# Patient Record
Sex: Male | Born: 1940 | Race: White | Hispanic: No | Marital: Married | Smoking: Former smoker
Health system: Southern US, Community
[De-identification: ages and names within clinical notes are randomized; demographics above are authoritative.]

## PROBLEM LIST (undated history)

## (undated) DIAGNOSIS — B159 Hepatitis A without hepatic coma: Secondary | ICD-10-CM

## (undated) DIAGNOSIS — G4733 Obstructive sleep apnea (adult) (pediatric): Secondary | ICD-10-CM

## (undated) DIAGNOSIS — G4730 Sleep apnea, unspecified: Secondary | ICD-10-CM

## (undated) DIAGNOSIS — N189 Chronic kidney disease, unspecified: Secondary | ICD-10-CM

## (undated) DIAGNOSIS — Z85828 Personal history of other malignant neoplasm of skin: Secondary | ICD-10-CM

## (undated) DIAGNOSIS — Z8619 Personal history of other infectious and parasitic diseases: Secondary | ICD-10-CM

## (undated) DIAGNOSIS — Z972 Presence of dental prosthetic device (complete) (partial): Secondary | ICD-10-CM

## (undated) DIAGNOSIS — C4490 Unspecified malignant neoplasm of skin, unspecified: Secondary | ICD-10-CM

## (undated) DIAGNOSIS — C801 Malignant (primary) neoplasm, unspecified: Secondary | ICD-10-CM

## (undated) DIAGNOSIS — F419 Anxiety disorder, unspecified: Secondary | ICD-10-CM

## (undated) DIAGNOSIS — E785 Hyperlipidemia, unspecified: Secondary | ICD-10-CM

## (undated) DIAGNOSIS — K219 Gastro-esophageal reflux disease without esophagitis: Secondary | ICD-10-CM

## (undated) DIAGNOSIS — F32A Depression, unspecified: Secondary | ICD-10-CM

## (undated) DIAGNOSIS — E782 Mixed hyperlipidemia: Secondary | ICD-10-CM

## (undated) DIAGNOSIS — M1A9XX0 Chronic gout, unspecified, without tophus (tophi): Secondary | ICD-10-CM

## (undated) DIAGNOSIS — N183 Chronic kidney disease, stage 3 unspecified: Secondary | ICD-10-CM

## (undated) DIAGNOSIS — N529 Male erectile dysfunction, unspecified: Secondary | ICD-10-CM

## (undated) DIAGNOSIS — Z86010 Personal history of colonic polyps: Secondary | ICD-10-CM

## (undated) DIAGNOSIS — Z860101 Personal history of adenomatous and serrated colon polyps: Secondary | ICD-10-CM

## (undated) DIAGNOSIS — N35919 Unspecified urethral stricture, male, unspecified site: Secondary | ICD-10-CM

## (undated) DIAGNOSIS — Z87442 Personal history of urinary calculi: Secondary | ICD-10-CM

## (undated) DIAGNOSIS — M1990 Unspecified osteoarthritis, unspecified site: Secondary | ICD-10-CM

## (undated) DIAGNOSIS — Z9889 Other specified postprocedural states: Secondary | ICD-10-CM

## (undated) DIAGNOSIS — K449 Diaphragmatic hernia without obstruction or gangrene: Secondary | ICD-10-CM

## (undated) HISTORY — PX: OTHER SURGICAL HISTORY: SHX169

## (undated) HISTORY — DX: Chronic kidney disease, unspecified: N18.9

## (undated) HISTORY — DX: Personal history of adenomatous and serrated colon polyps: Z86.0101

## (undated) HISTORY — DX: Unspecified osteoarthritis, unspecified site: M19.90

## (undated) HISTORY — DX: Unspecified malignant neoplasm of skin, unspecified: C44.90

## (undated) HISTORY — DX: Hyperlipidemia, unspecified: E78.5

## (undated) HISTORY — PX: TRANSURETHRAL RESECTION OF PROSTATE: SHX73

## (undated) HISTORY — DX: Gastro-esophageal reflux disease without esophagitis: K21.9

## (undated) HISTORY — PX: TONSILLECTOMY: SUR1361

## (undated) HISTORY — DX: Personal history of colonic polyps: Z86.010

## (undated) HISTORY — DX: Malignant (primary) neoplasm, unspecified: C80.1

## (undated) HISTORY — DX: Sleep apnea, unspecified: G47.30

## (undated) HISTORY — DX: Hepatitis a without hepatic coma: B15.9

---

## 1974-08-29 DIAGNOSIS — Z8619 Personal history of other infectious and parasitic diseases: Secondary | ICD-10-CM

## 1974-08-29 HISTORY — DX: Personal history of other infectious and parasitic diseases: Z86.19

## 1997-08-29 DIAGNOSIS — Z923 Personal history of irradiation: Secondary | ICD-10-CM

## 1997-08-29 DIAGNOSIS — Z8546 Personal history of malignant neoplasm of prostate: Secondary | ICD-10-CM

## 1997-08-29 HISTORY — DX: Personal history of malignant neoplasm of prostate: Z85.46

## 1997-08-29 HISTORY — DX: Personal history of irradiation: Z92.3

## 1997-08-29 HISTORY — PX: INSERTION PROSTATE RADIATION SEED: SUR718

## 1998-01-09 ENCOUNTER — Other Ambulatory Visit: Admission: RE | Admit: 1998-01-09 | Discharge: 1998-01-09 | Payer: Self-pay | Admitting: Urology

## 1998-03-17 ENCOUNTER — Encounter: Admission: RE | Admit: 1998-03-17 | Discharge: 1998-06-15 | Payer: Self-pay | Admitting: Radiation Oncology

## 1999-05-26 ENCOUNTER — Emergency Department (HOSPITAL_COMMUNITY): Admission: EM | Admit: 1999-05-26 | Discharge: 1999-05-26 | Payer: Self-pay

## 2002-08-29 HISTORY — PX: COLONOSCOPY W/ POLYPECTOMY: SHX1380

## 2005-01-10 ENCOUNTER — Ambulatory Visit: Payer: Self-pay | Admitting: Internal Medicine

## 2005-01-14 ENCOUNTER — Ambulatory Visit: Payer: Self-pay | Admitting: Internal Medicine

## 2005-07-19 ENCOUNTER — Ambulatory Visit: Payer: Self-pay | Admitting: Internal Medicine

## 2005-08-17 ENCOUNTER — Ambulatory Visit: Payer: Self-pay | Admitting: Internal Medicine

## 2005-08-29 HISTORY — PX: ESOPHAGEAL DILATION: SHX303

## 2005-09-19 ENCOUNTER — Ambulatory Visit: Payer: Self-pay | Admitting: Internal Medicine

## 2006-07-27 ENCOUNTER — Ambulatory Visit: Payer: Self-pay | Admitting: Internal Medicine

## 2006-07-27 LAB — CONVERTED CEMR LAB
ALT: 25 units/L (ref 0–40)
AST: 25 units/L (ref 0–37)
BUN: 18 mg/dL (ref 6–23)
Creatinine, Ser: 1.8 mg/dL — ABNORMAL HIGH (ref 0.4–1.5)
Glucose, Bld: 95 mg/dL (ref 70–99)
Uric Acid, Serum: 7.7 mg/dL — ABNORMAL HIGH (ref 2.4–7.0)

## 2006-08-02 ENCOUNTER — Encounter: Payer: Self-pay | Admitting: Internal Medicine

## 2006-08-15 ENCOUNTER — Ambulatory Visit: Payer: Self-pay | Admitting: Internal Medicine

## 2006-08-23 ENCOUNTER — Encounter: Payer: Self-pay | Admitting: Internal Medicine

## 2007-04-27 ENCOUNTER — Encounter (INDEPENDENT_AMBULATORY_CARE_PROVIDER_SITE_OTHER): Payer: Self-pay | Admitting: *Deleted

## 2007-04-27 ENCOUNTER — Emergency Department (HOSPITAL_COMMUNITY): Admission: EM | Admit: 2007-04-27 | Discharge: 2007-04-27 | Payer: Self-pay | Admitting: Emergency Medicine

## 2007-04-30 ENCOUNTER — Emergency Department (HOSPITAL_COMMUNITY): Admission: EM | Admit: 2007-04-30 | Discharge: 2007-04-30 | Payer: Self-pay | Admitting: Emergency Medicine

## 2007-06-12 ENCOUNTER — Encounter: Payer: Self-pay | Admitting: Internal Medicine

## 2007-06-12 ENCOUNTER — Ambulatory Visit: Payer: Self-pay | Admitting: Internal Medicine

## 2007-06-12 DIAGNOSIS — Z8546 Personal history of malignant neoplasm of prostate: Secondary | ICD-10-CM | POA: Insufficient documentation

## 2007-06-12 DIAGNOSIS — E782 Mixed hyperlipidemia: Secondary | ICD-10-CM | POA: Insufficient documentation

## 2007-06-12 DIAGNOSIS — Z87442 Personal history of urinary calculi: Secondary | ICD-10-CM | POA: Insufficient documentation

## 2007-06-12 DIAGNOSIS — M109 Gout, unspecified: Secondary | ICD-10-CM | POA: Insufficient documentation

## 2007-06-12 LAB — CONVERTED CEMR LAB
Cholesterol, target level: 200 mg/dL
HDL goal, serum: 40 mg/dL
LDL Goal: 160 mg/dL

## 2007-06-15 ENCOUNTER — Encounter: Payer: Self-pay | Admitting: Internal Medicine

## 2007-06-24 LAB — CONVERTED CEMR LAB
ALT: 21 units/L (ref 0–53)
AST: 20 units/L (ref 0–37)
BUN: 13 mg/dL (ref 6–23)
Cholesterol: 177 mg/dL (ref 0–200)
Creatinine, Ser: 1.5 mg/dL (ref 0.4–1.5)
Direct LDL: 103.1 mg/dL
HDL: 26 mg/dL — ABNORMAL LOW (ref >39.0)
Hgb A1c MFr Bld: 5.3 % (ref 4.6–6.0)
Potassium: 5.2 meq/L — ABNORMAL HIGH (ref 3.5–5.1)
Total CHOL/HDL Ratio: 6.8
Triglycerides: 247 mg/dL (ref 0–149)
Uric Acid, Serum: 6.2 mg/dL (ref 2.4–7.0)
VLDL: 49 mg/dL — ABNORMAL HIGH (ref 0–40)

## 2007-06-26 ENCOUNTER — Encounter (INDEPENDENT_AMBULATORY_CARE_PROVIDER_SITE_OTHER): Payer: Self-pay | Admitting: *Deleted

## 2007-07-17 ENCOUNTER — Telehealth (INDEPENDENT_AMBULATORY_CARE_PROVIDER_SITE_OTHER): Payer: Self-pay | Admitting: *Deleted

## 2007-07-19 ENCOUNTER — Telehealth (INDEPENDENT_AMBULATORY_CARE_PROVIDER_SITE_OTHER): Payer: Self-pay | Admitting: *Deleted

## 2007-07-20 ENCOUNTER — Ambulatory Visit: Payer: Self-pay | Admitting: Internal Medicine

## 2007-07-20 ENCOUNTER — Encounter (INDEPENDENT_AMBULATORY_CARE_PROVIDER_SITE_OTHER): Payer: Self-pay | Admitting: *Deleted

## 2007-08-01 ENCOUNTER — Telehealth (INDEPENDENT_AMBULATORY_CARE_PROVIDER_SITE_OTHER): Payer: Self-pay | Admitting: *Deleted

## 2007-08-06 ENCOUNTER — Ambulatory Visit: Payer: Self-pay | Admitting: Internal Medicine

## 2007-08-14 ENCOUNTER — Ambulatory Visit: Payer: Self-pay | Admitting: Internal Medicine

## 2007-08-14 ENCOUNTER — Encounter: Payer: Self-pay | Admitting: Internal Medicine

## 2007-08-29 ENCOUNTER — Telehealth: Payer: Self-pay | Admitting: Internal Medicine

## 2007-09-03 ENCOUNTER — Telehealth (INDEPENDENT_AMBULATORY_CARE_PROVIDER_SITE_OTHER): Payer: Self-pay | Admitting: *Deleted

## 2008-01-15 ENCOUNTER — Telehealth (INDEPENDENT_AMBULATORY_CARE_PROVIDER_SITE_OTHER): Payer: Self-pay | Admitting: *Deleted

## 2008-06-23 ENCOUNTER — Encounter: Payer: Self-pay | Admitting: Internal Medicine

## 2008-07-04 ENCOUNTER — Encounter: Payer: Self-pay | Admitting: Internal Medicine

## 2008-07-22 ENCOUNTER — Telehealth: Payer: Self-pay | Admitting: Internal Medicine

## 2008-07-25 ENCOUNTER — Ambulatory Visit: Payer: Self-pay | Admitting: Internal Medicine

## 2008-07-25 DIAGNOSIS — R944 Abnormal results of kidney function studies: Secondary | ICD-10-CM

## 2008-07-25 DIAGNOSIS — Z85828 Personal history of other malignant neoplasm of skin: Secondary | ICD-10-CM

## 2008-07-25 DIAGNOSIS — R030 Elevated blood-pressure reading, without diagnosis of hypertension: Secondary | ICD-10-CM | POA: Insufficient documentation

## 2008-07-29 ENCOUNTER — Telehealth (INDEPENDENT_AMBULATORY_CARE_PROVIDER_SITE_OTHER): Payer: Self-pay | Admitting: *Deleted

## 2008-08-04 ENCOUNTER — Telehealth (INDEPENDENT_AMBULATORY_CARE_PROVIDER_SITE_OTHER): Payer: Self-pay | Admitting: *Deleted

## 2008-08-18 ENCOUNTER — Ambulatory Visit: Payer: Self-pay | Admitting: Internal Medicine

## 2008-08-19 ENCOUNTER — Encounter: Payer: Self-pay | Admitting: Internal Medicine

## 2008-08-19 ENCOUNTER — Encounter (INDEPENDENT_AMBULATORY_CARE_PROVIDER_SITE_OTHER): Payer: Self-pay | Admitting: *Deleted

## 2008-08-19 ENCOUNTER — Telehealth (INDEPENDENT_AMBULATORY_CARE_PROVIDER_SITE_OTHER): Payer: Self-pay | Admitting: *Deleted

## 2008-08-19 DIAGNOSIS — N259 Disorder resulting from impaired renal tubular function, unspecified: Secondary | ICD-10-CM | POA: Insufficient documentation

## 2008-08-19 LAB — CONVERTED CEMR LAB
BUN: 19 mg/dL (ref 6–23)
Creatinine, Ser: 1.8 mg/dL — ABNORMAL HIGH (ref 0.4–1.5)
Potassium: 4.1 meq/L (ref 3.5–5.1)
Uric Acid, Serum: 6.6 mg/dL (ref 4.0–7.8)

## 2008-08-20 ENCOUNTER — Encounter: Payer: Self-pay | Admitting: Internal Medicine

## 2008-08-29 HISTORY — PX: CYSTOSCOPY: SUR368

## 2008-09-11 ENCOUNTER — Encounter: Admission: RE | Admit: 2008-09-11 | Discharge: 2008-09-11 | Payer: Self-pay | Admitting: Internal Medicine

## 2008-09-11 ENCOUNTER — Encounter: Payer: Self-pay | Admitting: Internal Medicine

## 2008-09-12 ENCOUNTER — Encounter: Payer: Self-pay | Admitting: Internal Medicine

## 2008-09-18 ENCOUNTER — Telehealth (INDEPENDENT_AMBULATORY_CARE_PROVIDER_SITE_OTHER): Payer: Self-pay | Admitting: *Deleted

## 2008-09-22 ENCOUNTER — Encounter: Payer: Self-pay | Admitting: Internal Medicine

## 2009-05-12 ENCOUNTER — Encounter: Payer: Self-pay | Admitting: Internal Medicine

## 2009-06-11 ENCOUNTER — Telehealth: Payer: Self-pay | Admitting: Internal Medicine

## 2009-06-11 DIAGNOSIS — R1319 Other dysphagia: Secondary | ICD-10-CM

## 2009-06-15 ENCOUNTER — Ambulatory Visit: Payer: Self-pay | Admitting: Internal Medicine

## 2009-06-15 ENCOUNTER — Encounter: Payer: Self-pay | Admitting: Internal Medicine

## 2009-06-16 ENCOUNTER — Encounter: Payer: Self-pay | Admitting: Internal Medicine

## 2009-06-19 ENCOUNTER — Ambulatory Visit: Payer: Self-pay | Admitting: Internal Medicine

## 2009-07-07 ENCOUNTER — Encounter: Payer: Self-pay | Admitting: Internal Medicine

## 2009-07-24 ENCOUNTER — Telehealth: Payer: Self-pay | Admitting: Internal Medicine

## 2009-07-24 DIAGNOSIS — R51 Headache: Secondary | ICD-10-CM

## 2009-07-24 DIAGNOSIS — R519 Headache, unspecified: Secondary | ICD-10-CM | POA: Insufficient documentation

## 2009-08-03 ENCOUNTER — Ambulatory Visit: Payer: Self-pay | Admitting: Internal Medicine

## 2009-08-03 DIAGNOSIS — E559 Vitamin D deficiency, unspecified: Secondary | ICD-10-CM | POA: Insufficient documentation

## 2009-08-10 ENCOUNTER — Encounter: Payer: Self-pay | Admitting: Internal Medicine

## 2010-04-21 ENCOUNTER — Telehealth: Payer: Self-pay | Admitting: Internal Medicine

## 2010-05-25 ENCOUNTER — Encounter: Payer: Self-pay | Admitting: Internal Medicine

## 2010-05-26 ENCOUNTER — Encounter: Payer: Self-pay | Admitting: Internal Medicine

## 2010-06-16 ENCOUNTER — Ambulatory Visit: Payer: Self-pay | Admitting: Internal Medicine

## 2010-06-16 ENCOUNTER — Encounter: Payer: Self-pay | Admitting: Internal Medicine

## 2010-06-16 DIAGNOSIS — D126 Benign neoplasm of colon, unspecified: Secondary | ICD-10-CM | POA: Insufficient documentation

## 2010-08-16 ENCOUNTER — Telehealth (INDEPENDENT_AMBULATORY_CARE_PROVIDER_SITE_OTHER): Payer: Self-pay | Admitting: *Deleted

## 2010-08-17 ENCOUNTER — Ambulatory Visit: Payer: Self-pay | Admitting: Internal Medicine

## 2010-08-24 LAB — CONVERTED CEMR LAB
ALT: 88 units/L — ABNORMAL HIGH (ref 0–53)
AST: 71 units/L — ABNORMAL HIGH (ref 0–37)
Albumin: 3.7 g/dL (ref 3.5–5.2)
Cholesterol: 166 mg/dL (ref 0–200)
Total Protein: 6.4 g/dL (ref 6.0–8.3)

## 2010-08-25 ENCOUNTER — Telehealth: Payer: Self-pay | Admitting: Internal Medicine

## 2010-09-05 ENCOUNTER — Emergency Department (HOSPITAL_COMMUNITY)
Admission: EM | Admit: 2010-09-05 | Discharge: 2010-09-05 | Payer: Self-pay | Source: Home / Self Care | Admitting: Emergency Medicine

## 2010-09-07 ENCOUNTER — Inpatient Hospital Stay (HOSPITAL_COMMUNITY)
Admission: EM | Admit: 2010-09-07 | Discharge: 2010-09-08 | Payer: Self-pay | Source: Home / Self Care | Attending: Urology | Admitting: Urology

## 2010-09-08 HISTORY — PX: CYSTOSCOPY W/ URETERAL STENT PLACEMENT: SHX1429

## 2010-09-09 ENCOUNTER — Ambulatory Visit: Admit: 2010-09-09 | Payer: Self-pay | Admitting: Internal Medicine

## 2010-09-13 LAB — URINALYSIS, ROUTINE W REFLEX MICROSCOPIC
Bilirubin Urine: NEGATIVE
Hgb urine dipstick: NEGATIVE
Ketones, ur: NEGATIVE mg/dL
Nitrite: NEGATIVE
Protein, ur: NEGATIVE mg/dL
Specific Gravity, Urine: 1.016 (ref 1.005–1.030)
Urine Glucose, Fasting: NEGATIVE mg/dL
Urobilinogen, UA: 0.2 mg/dL (ref 0.0–1.0)
pH: 5.5 (ref 5.0–8.0)

## 2010-09-13 LAB — POCT I-STAT, CHEM 8
BUN: 27 mg/dL — ABNORMAL HIGH (ref 6–23)
BUN: 26 mg/dL — ABNORMAL HIGH (ref 6–23)
Calcium, Ion: 1.04 mmol/L — ABNORMAL LOW (ref 1.12–1.32)
Calcium, Ion: 1.18 mmol/L (ref 1.12–1.32)
Chloride: 112 mEq/L (ref 96–112)
Chloride: 108 mEq/L (ref 96–112)
Creatinine, Ser: 2.4 mg/dL — ABNORMAL HIGH (ref 0.4–1.5)
Creatinine, Ser: 3.3 mg/dL — ABNORMAL HIGH (ref 0.4–1.5)
Glucose, Bld: 140 mg/dL — ABNORMAL HIGH (ref 70–99)
Glucose, Bld: 110 mg/dL — ABNORMAL HIGH (ref 70–99)
HCT: 46 % (ref 39.0–52.0)
HCT: 40 % (ref 39.0–52.0)
Hemoglobin: 15.6 g/dL (ref 13.0–17.0)
Hemoglobin: 13.6 g/dL (ref 13.0–17.0)
Potassium: 5.5 mEq/L — ABNORMAL HIGH (ref 3.5–5.1)
Potassium: 4.7 mEq/L (ref 3.5–5.1)
Sodium: 139 mEq/L (ref 135–145)
Sodium: 137 mEq/L (ref 135–145)
TCO2: 21 mmol/L (ref 0–100)
TCO2: 23 mmol/L (ref 0–100)

## 2010-09-13 LAB — BASIC METABOLIC PANEL
BUN: 22 mg/dL (ref 6–23)
CO2: 24 mEq/L (ref 19–32)
Calcium: 8.7 mg/dL (ref 8.4–10.5)
Chloride: 110 mEq/L (ref 96–112)
Creatinine, Ser: 2.93 mg/dL — ABNORMAL HIGH (ref 0.4–1.5)
GFR calc Af Amer: 26 mL/min — ABNORMAL LOW (ref >60)
GFR calc non Af Amer: 21 mL/min — ABNORMAL LOW (ref >60)
Glucose, Bld: 116 mg/dL — ABNORMAL HIGH (ref 70–99)
Potassium: 5.5 mEq/L — ABNORMAL HIGH (ref 3.5–5.1)
Sodium: 139 mEq/L (ref 135–145)

## 2010-09-13 LAB — CBC
HCT: 40.3 % (ref 39.0–52.0)
HCT: 40.6 % (ref 39.0–52.0)
Hemoglobin: 14.1 g/dL (ref 13.0–17.0)
Hemoglobin: 13.8 g/dL (ref 13.0–17.0)
MCH: 33.3 pg (ref 26.0–34.0)
MCH: 32.2 pg (ref 26.0–34.0)
MCHC: 35 g/dL (ref 30.0–36.0)
MCHC: 34 g/dL (ref 30.0–36.0)
MCV: 95 fL (ref 78.0–100.0)
MCV: 94.9 fL (ref 78.0–100.0)
Platelets: 129 10*3/uL — ABNORMAL LOW (ref 150–400)
Platelets: 110 10*3/uL — ABNORMAL LOW (ref 150–400)
RBC: 4.24 MIL/uL (ref 4.22–5.81)
RBC: 4.28 MIL/uL (ref 4.22–5.81)
RDW: 12.7 % (ref 11.5–15.5)
RDW: 12.6 % (ref 11.5–15.5)
WBC: 9.1 10*3/uL (ref 4.0–10.5)
WBC: 9.8 10*3/uL (ref 4.0–10.5)

## 2010-09-13 LAB — URINE CULTURE
Colony Count: NO GROWTH
Culture  Setup Time: 201201101148
Culture: NO GROWTH

## 2010-09-15 ENCOUNTER — Ambulatory Visit
Admission: RE | Admit: 2010-09-15 | Discharge: 2010-09-15 | Payer: Self-pay | Source: Home / Self Care | Attending: Urology | Admitting: Urology

## 2010-09-15 HISTORY — PX: CYSTOSCOPY/RETROGRADE/URETEROSCOPY/STONE EXTRACTION WITH BASKET: SHX5317

## 2010-09-15 LAB — CULTURE, BLOOD (ROUTINE X 2)
Culture  Setup Time: 201201110549
Culture  Setup Time: 201201110549
Culture: NO GROWTH
Culture: NO GROWTH

## 2010-09-20 LAB — BASIC METABOLIC PANEL
BUN: 26 mg/dL — ABNORMAL HIGH (ref 6–23)
CO2: 27 mEq/L (ref 19–32)
Calcium: 9.1 mg/dL (ref 8.4–10.5)
Chloride: 106 mEq/L (ref 96–112)
Creatinine, Ser: 2.09 mg/dL — ABNORMAL HIGH (ref 0.4–1.5)
GFR calc Af Amer: 38 mL/min — ABNORMAL LOW (ref >60)
GFR calc non Af Amer: 32 mL/min — ABNORMAL LOW (ref >60)
Glucose, Bld: 102 mg/dL — ABNORMAL HIGH (ref 70–99)
Potassium: 4.4 mEq/L (ref 3.5–5.1)
Sodium: 140 mEq/L (ref 135–145)

## 2010-09-20 LAB — POCT HEMOGLOBIN-HEMACUE: Hemoglobin: 15.5 g/dL (ref 13.0–17.0)

## 2010-09-20 NOTE — Op Note (Signed)
  NAMEADOLPHO, Chris Burton             ACCOUNT NO.:  [id number]  MEDICAL RECORD NO.:  [id number]          PATIENT TYPE:  INP  LOCATION:  1433                         FACILITY:  [facility]  PHYSICIAN:  Valetta Fuller, M.D.  DATE OF BIRTH:  1941-08-17  DATE OF PROCEDURE: DATE OF DISCHARGE:                              OPERATIVE REPORT   PREOPERATIVE DIAGNOSES: 1. A 5-6 mm proximal right ureteral calculus. 2. Possible febrile urinary tract infection.  POSTOPERATIVE DIAGNOSES: 1. A 5-6 mm proximal right ureteral calculus. 2. Possible febrile urinary tract infection.  PROCEDURE PERFORMED:  Cystoscopy, retrograde pyelogram, and right double- J stent insertion.  SURGEON:  Valetta Fuller, MD  ANESTHESIA:  General.  INDICATIONS:  Mr. Hettich is 70 years of age.  He has had some longstanding renal insufficiency with a creatinine typically around 1.9- 2.0.  Approximately 48 hours ago, the patient developed right flank pain and was seen in the local emergency room where he was diagnosed with 5-6 mm right proximal stone.  He represented yesterday to the hospital with low grade fever and some chills.  Urinalysis relatively unremarkable. Creatinine further to 3.38.  KUB showed stone to be in similar position in the proximal right ureter.  The patient was admitted and started on antibiotic therapy.  He remained stable.  He did develop a fever of 101.7.  For that reason, we felt he was not a good candidate for stone manipulation but should have a double-J stent placed to unobstructed kidney with definitive management at a later date.  He appeared to understand these issues.  Full informed consent obtained.  TECHNIQUE AND FINDINGS:  The patient was brought to the operating room. He had successful induction of general anesthesia.  He was placed in lithotomy position and prepped and draped in usual manner.  Cystoscopy revealed unremarkable anterior urethra, prostatic urethra, and  bladder.  Retrograde pyelogram was done with an open-ended catheter with fluoroscopic interpretation.  A 5-6 mm filling defect was noted in the proximal ureter causing at least partial obstruction with a mildly dilated proximal ureter and collecting system.  Guidewire was placed to the right renal pelvis.  A 7-French 24 cm stent was then placed with visual as well as fluoroscopic guidance.  The patient had no obvious complications and was brought to recovery room in stable condition.     Valetta Fuller, M.D.     DSG/MEDQ  D:  09/08/2010  T:  09/08/2010  Job:  [id number]  cc:   Titus Dubin. Alwyn Ren, MD,FACP,FCCP [phone] [place] [address]  Electronically Signed by Barron Alvine M.D. on 09/20/2010 09:21:47 AM

## 2010-09-20 NOTE — Consult Note (Signed)
Chris Burton, Chris Burton NO.:  [id number]  MEDICAL RECORD NO.:  [id number]          PATIENT TYPE:  EMS  LOCATION:  ED                           FACILITY:  [facility]  PHYSICIAN:  Valetta Fuller, M.D.  DATE OF BIRTH:  06/30/1941  DATE OF CONSULTATION:  09/07/2010 DATE OF DISCHARGE:                                CONSULTATION   CHIEF COMPLAINT:  An approximate 5-6 mm right mid ureteral calculus.  HISTORY OF PRESENT ILLNESS:  This is a 70 year old gentleman who began having complaints of low abdominal pain, nausea, vomiting and right flank pain approximately 2 days ago.  He presented to [facility] Emergency Room at that time.  A CT of his abdomen and pelvis were obtained which showed bilateral nephrolithiasis with an obstructing 5 mm proximal right ureteral calculus.  He was discharged from the emergency room with attempts to pass the stone on his own.  He was provided a prescription for pain and for nausea and vomiting.  He was told to return for fever, chills or continued nausea and vomiting as well as uncontrollable pain. Today he presents  back to [facility] Emergency Room for complaints of fever up to 100.9, nausea, right flank pain with radiation to the right groin.  He denies any urinary urgency, urinary frequency, dysuria or hematuria.  He denies nocturia.  He does complain of diarrhea.  He denies any chest pain or shortness of breath.  Upon presentation to the emergency room a KUB was obtained which showed an approximate 6-mm right mid ureteral calculus between L3 and L4 transverse process.  The position is unchanged since his CT scan on September 05, 2010.  His urinalysis today in the emergency room is negative.  His white blood cell count is 9.1 and his serum creatinine is 3.3 (up from 2.4 on September 05, 2010).  The patient does have a history of renal insufficiency with his baseline at approximately 2.0.  He does see a nephrologist for this.  PAST MEDICAL  HISTORY: 1. Gout. 2. History of hepatitis A. 3. Nephrolithiasis. 4. Prostate cancer.  PAST SURGICAL HISTORY: 1. Tonsillectomy. 2. Placement of radiation seeds for prostate cancer.  FAMILY HISTORY:  Noncontributory.  SOCIAL HISTORY:  He denies tobacco use.  He does occasionally use alcohol.  He is married and lives in [place].  ALLERGIES:  METHYLPARABEN.  MEDICATIONS: 1. Metamucil. 2. Multivitamins. 3. Probenecid. 4. Oxycodone p.r.n. 5. Phenergan p.r.n.  REVIEW OF SYSTEMS:  As stated per HPI.  PHYSICAL EXAMINATION:  VITAL SIGNS:  Temperature 98.1, pulse 92, respiration 20, blood pressure 120/76. CONSTITUTIONAL:  He is a well-developed, well-nourished white male in no acute distress. HEENT:  Normocephalic, atraumatic.  Oropharynx is clear. CARDIOVASCULAR:  Regular rate and rhythm. LUNGS:  Clear to auscultation. ABDOMEN:  Soft, nontender, nondistended with hypoactive bowel sounds and no CVA tenderness. GENITOURINARY:  Penis was without lesion or mass, bilateral descended testes. EXTREMITIES:  Nontender, no atrophy.  LABORATORY DATA: 1. Sodium is 137, potassium 4.7, chloride 108, CO2 23, BUN 26,     creatinine 3.3 (up from 2.4 on September 05, 2010). 2. WBC is 9.1, hemoglobin 13.6, hematocrit  40.0, platelets 129.  RADIOLOGY: 1. CT of the abdomen and pelvis from September 05, 2010, shows bilateral     nephrolithiasis with an obstructing right proximal ureteral     calculus. 2. KUB from September 07, 2010, showed an approximate 6-mm right mid     ureteral calculus located between L3 and L4 transverse process,     unchanged from CT scan on September 05, 2010.  IMPRESSION AND PLAN: 1. A 5-6 mm right mid ureteral calculus. 2. Renal insufficiency with creatinine at 3.3 (baseline 2.0).  We will     plan to admit for IV hydration as well as pain control.  We will     place him nothing by mouth after midnight and Dr. Isabel Caprice will see     this gentleman later this afternoon.  We  will place him on the     surgical schedule for September 08, 2010, for procedure consisting of     cystoscopy, ureteroscopy, right retrograde pyelography, right     double-J stent placement and right holmium laser lithotripsy.     Delia Chimes, NP   ______________________________ Valetta Fuller, M.D.    MA/MEDQ  D:  09/07/2010  T:  09/07/2010  Job:  [id number]  Electronically Signed by Delia Chimes NP on 09/08/2010 11:46:04 AM Electronically Signed by Barron Alvine M.D. on 09/20/2010 09:21:44 AM

## 2010-09-20 NOTE — Op Note (Signed)
  Chris Burton, Chris Burton             ACCOUNT NO.:  [id number]  MEDICAL RECORD NO.:  [id number]          PATIENT TYPE:  AMB  LOCATION:  NESC                         FACILITY:  [facility]  PHYSICIAN:  Valetta Fuller, M.D.  DATE OF BIRTH:  08-23-1941  DATE OF PROCEDURE: DATE OF DISCHARGE:                              OPERATIVE REPORT   PREOPERATIVE DIAGNOSIS:  Right proximal ureteral calculus.  POSTOPERATIVE DIAGNOSIS:  Right proximal ureteral calculus.  PROCEDURES PERFORMED:  Cystoscopy, right double-J stent removal, ureteroscopy, basket extraction of ureteral calculus and replacement of double-J stent.  SURGEON:  Valetta Fuller, M.D.  ANESTHESIA:  General.  INDICATIONS:  [name] is 70 years of age.  He had presented with abdominal pain and right flank pain.  CT showed some bilateral nephrolithiasis with an obstructing 5-6 mm proximal ureteral stone.  He was initially discharged to home but then returned because of low grade temperature and chills.  The patient was admitted and developed a fever of 101.7.  His baseline creatinine which is normally around 2.0 had increased to 3.3.  For that reason, the patient underwent a cystoscopy with retrograde pyelogram and placement of a right double-J stent.  We did not feel that stone manipulation was indicated at that time given the febrile history and concern over possible concurrent urinary tract infection.  Double-J stent was placed.  The patient's situation clinically has improved dramatically.  The double-J stent has been well tolerated by the patient.  It has been indwelling for approximately 1 week.  He presents now for definitive management of the stone.  TECHNIQUE AND FINDINGS:  The patient was brought to the operating room. He had successful induction of general anesthesia, was placed in lithotomy position and prepped and draped in the usual manner. Appropriate surgical time-out was performed.  Cystoscopy again revealed an  unremarkable anterior urethra.  Mild trilobar hyperplasia.  Stent was seen in good position.  The stent was partially extracted and the guidewire placed through the stent.  The right ureter was then easily engaged with a 6.5 [place] rigid ureteroscope.  Approximately two-thirds of the way up the ureter at the junction of the proximal and mid ureter, a 5-6 mm hard calculus was noted.  This was basket extracted without difficulty.  There was some evidence of mucosal inflammation and edema where the stone had been previously impacted.  For that reason, over the guidewire, we placed a new 7-French 24 cm double-J stent with a dangle string.  Fluoroscopic and visual guidance was used to assure good positioning.  The string was secured to the patient's penis and lidocaine jelly was instilled.  The patient appeared to tolerate the procedure well, had no obvious complications or difficulties, and was brought to recovery room in stable condition.     Valetta Fuller, M.D.     DSG/MEDQ  D:  09/15/2010  T:  09/15/2010  Job:  [id number] Electronically Signed by Barron Alvine M.D. on 09/20/2010 09:21:52 AM

## 2010-09-26 LAB — CONVERTED CEMR LAB: LDL Goal: 100 mg/dL

## 2010-09-28 NOTE — Letter (Signed)
Summary: [facility]  [facility]   Imported By: Lanelle Bal 06/09/2010 13:54:59  _____________________________________________________________________  External Attachment:    Type:   Image     Comment:   External Document

## 2010-09-28 NOTE — Progress Notes (Signed)
Summary: Med refill  ---- Converted from flag ---- ---- 04/21/2010 3:37 PM, Marga Melnick MD wrote: please put these in as med refill , not Appen. Refill X 6 mos ------------------------------  Phone Note Refill Request   Refills Requested: Medication #1:  PROBENECID 500 MG  TABS 2 tabs qd Initial call taken by: Lucious Groves CMA,  April 21, 2010 3:52 PM    Prescriptions: PROBENECID 500 MG  TABS (PROBENECID) 2 tabs qd  #420 x 6   Entered by:   Lucious Groves CMA   Authorized by:   Marga Melnick MD   Signed by:   Lucious Groves CMA on 04/21/2010   Method used:   [facility]  [address] [phone][facility] (retail)       [address]       [place], [place]  [address]       Ph: [phone] or [phone]       Fax: [phone]   RxID:   [id number]

## 2010-09-28 NOTE — Letter (Signed)
Summary: Letter to Patient with Lab Results/[facility]  Letter to Patient with Lab Results/[facility]   Imported By: Lanelle Bal 06/19/2010 10:39:08  _____________________________________________________________________  External Attachment:    Type:   Image     Comment:   External Document

## 2010-09-28 NOTE — Assessment & Plan Note (Signed)
Summary: YEARLY//PH   Vital Signs:  Patient profile:   70 year old male Height:      71.5 inches Weight:      197.6 pounds BMI:     27.27 Temp:     99.0 degrees F oral Pulse rate:   72 / minute Resp:     16 per minute BP sitting:   120 / 78  (left arm) Cuff size:   large  Vitals Entered By: Shonna Chock CMA (June 16, 2010 10:56 AM) CC: CPX Comments Flu & TDaP Vaccine UTD (per patient)   CC:  CPX.  History of Present Illness: Hyperlipidemia Follow-Up      This is a 70 year old man who presents for Hyperlipidemia follow-up.  The patient reports  recent constipation, but denies muscle aches, GI upset, abdominal pain, flushing, itching, and diarrhea.  The patient denies the following symptoms: chest pain/pressure, exercise intolerance, dypsnea, palpitations, syncope, and pedal edema.  Dietary compliance has been good- excellent.  The patient reports exercising daily as 45 min of calisthentics & CVE 90 min 3X/week @ gym.  Adjunctive measures currently used by the patient include fiber( Metamucil)  and niacin in a B complex. Lipids 05/25/2010 @ [facility] : LDL 117, TG 193, HDL 33 on no Rx meds.  LDL goal = < 100 as per Dr Rosey Bath, Nephrologist as  CRD is MI risk.Dr Kyra Manges notes reviewed.Because of CRD & PMH of gout  his diet is heavily weighted with carbs. This contributes to the TG elevation as noted.  Current Medications (verified): 1)  Probenecid 500 Mg  Tabs (Probenecid) .... 2 Tabs Qd 2)  Chloest Off 900mg  .... 1 By Mouth Two Times A Day 3)  Prostate Health  Caps (Misc Natural Products) .Marland Kitchen.. 1 By Mouth Once Daily 4)  Fiber 0.52 Gm Caps (Psyllium) .... 4 By Mouth Once Daily 5)  Super B Complex  Tabs (B Complex-C) .Marland Kitchen.. 1 By Mouth Two Times A Day 6)  Melatonin 3 Mg Caps (Melatonin) .Marland Kitchen.. 1 By Mouth At Bedtime As Needed  Allergies: 1)  ! * Fish Oil 2)  ! * Methoparobam  Past History:  Past Medical History: Gout Hyperlipidemia: LDL goal = < 100 Nephrolithiasis,PMH  of Prostate  cancer, PMH  of S/P seed implants & external radiation, Dr Charlett Blake , [facility] , [place] Hepatitis A, PMH of  1976 Skin cancer,PMH  of, basal cell  Trochlear Headaches, onset with Niacin & fish oil supplements  Past Surgical History: Tonsillectomy Colon polypectomy 2004 1999 External beam radiation & seed implants for prostate cancer, Dr Charlett Blake, [facility] Colon polypectomy 2004, 2008; Endoscopy  & esophageal dilation 2010, Dr Juanda Chance  Family History: Father: prostate  cancer, migraines Mother: CHF,lung cancer, smoker Siblings: sister cancer stomach; no FH renal disease, CVA or MI  Social History: low purine, increased carb Occupation:Professor of Archeology Married Former Smoker: quit age 70 Alcohol use-no Regular exercise-yes. He spends 7 months in [place] with sun exposure & no processed foods & drinks ; these factors will impact  his vit D & lipid risks.His exercise level will increase in [place]  Review of Systems  The patient denies anorexia, fever, vision loss, decreased hearing, hoarseness, prolonged cough, hemoptysis, abdominal pain, melena, hematochezia, severe indigestion/heartburn, hematuria, suspicious skin lesions, unusual weight change, abnormal bleeding, enlarged lymph nodes, and angioedema.   Psych:  Complains of depression, easily angered, easily tearful, and irritability; denies anxiety; Situational depression; family stresses. Meds declined. Allergy:  Complains of itching eyes, seasonal allergies, and sneezing;  No meds.  Physical Exam  General:  well-nourished;alert,appropriate and cooperative throughout examination Neck:  No deformities, masses, or tenderness noted. Lungs:  Normal respiratory effort, chest expands symmetrically. Lungs are clear to auscultation, no crackles or wheezes. Heart:  Normal rate and regular rhythm. S1 and S2 normal without gallop, murmur, click, rub .S4 with slurring Abdomen:  Bowel sounds positive,abdomen soft and non-tender without masses,  organomegaly or hernias noted. Genitalia:  Dr Charlett Blake Prostate:  Dr Charlett Blake Msk:  No deformity or scoliosis noted of thoracic or lumbar spine.   Pulses:  R and L carotid,radial,dorsalis pedis and posterior tibial pulses are full and equal bilaterally Extremities:  No clubbing, cyanosis, edema, or deformity noted with normal full range of motion of all joints.   Neurologic:  alert & oriented X3 and DTRs symmetrical and normal.   Skin:  Intact without suspicious lesions or rashes. Cherry angiomata Cervical Nodes:  No lymphadenopathy noted Axillary Nodes:  No palpable lymphadenopathy Psych:  memory intact for recent and remote, normally interactive, good eye contact, not anxious appearing, and not depressed appearing.     Impression & Recommendations:  Problem # 1:  HYPERLIPIDEMIA (ICD-272.2)  LDL goal = < 100  Orders: EKG w/ Interpretation (93000)  His updated medication list for this problem includes:    Trilipix 135 Mg Cpdr (Choline fenofibrate) .Marland Kitchen... 1 once daily  Problem # 2:  RENAL INSUFFICIENCY (ICD-588.9) CRD  Stage III, as per Dr Rosey Bath  Problem # 3:  PROSTATE CANCER, HX OF (ICD-V10.46) as per Dr Charlett Blake  Problem # 4:  GOUT (ICD-274.9) quiescent , uric acid 6.7 His updated medication list for this problem includes:    Probenecid 500 Mg Tabs (Probenecid) .Marland Kitchen... 2 tabs qd  Problem # 5:  COLONIC POLYPS (ICD-211.3) Colonoscopy as per Dr Juanda Chance  Complete Medication List: 1)  Probenecid 500 Mg Tabs (Probenecid) .... 2 tabs qd 2)  Chloest Off 900mg   .... 1 by mouth two times a day 3)  Prostate Health Caps (Misc natural products) .Marland Kitchen.. 1 by mouth once daily 4)  Fiber 0.52 Gm Caps (Psyllium) .... 4 by mouth once daily 5)  Super B Complex Tabs (B complex-c) .Marland Kitchen.. 1 by mouth two times a day 6)  Melatonin 3 Mg Caps (Melatonin) .Marland Kitchen.. 1 by mouth at bedtime as needed 7)  Trilipix 135 Mg Cpdr (Choline fenofibrate) .Marland Kitchen.. 1 once daily  Patient Instructions: 1)  Please schedule a  follow-up  fasting Lab appointment in 2 months for :Hepatic Panel :995.20; 2)  Lipid Panel:272.4. Prescriptions: TRILIPIX 135 MG CPDR (CHOLINE FENOFIBRATE) 1 once daily  #30 x 2   Entered and Authorized by:   Marga Melnick MD   Signed by:   Marga Melnick MD on 06/16/2010   Method used:   Printed then faxed to ...       [facility]  [phone]* (retail)       [address] [place], [place]  [address]       Ph: [phone] or [phone]       Fax: [phone]   RxID:   [id number]    Orders Added: 1)  Est. Patient Level IV [34742] 2)  EKG w/ Interpretation [93000]

## 2010-09-30 NOTE — Progress Notes (Signed)
Summary: Results   Phone Note Call from Patient   Details for Reason: Two liver enzymes are elevated ; these were 23 & 22 on 05/26/2010 @ [facility]. Please stop Trilipix & avoid excess Tylenol, alcohol & vitamin A. Please repeat fasting AST & ALT in 10-14 days. (790.4). Hopp Summary of Call: Patient left message on triage requesting his results. I called to notify, left message on machine to call back to office. Chris Burton CMA  August 25, 2010 12:04 PM   Patient notified. Chris Burton CMA  August 25, 2010 3:24 PM

## 2010-09-30 NOTE — Progress Notes (Signed)
Summary: Refill Request  Phone Note Refill Request Call back at [phone] Message from:  Pharmacy on August 16, 2010 3:42 PM  Refills Requested: Medication #1:  TRILIPIX 135 MG CPDR 1 once daily.   Dosage confirmed as above?Dosage Confirmed   Supply Requested: 6 months   Notes: Patient is going to [place] for 6 months, needs rx to cover this[facility] [address]   Next Appointment Scheduled: 12.20.11 9lab) Initial call taken by: Harold Barban,  August 16, 2010 3:43 PM    New/Updated Medications: TRILIPIX 135 MG CPDR (CHOLINE FENOFIBRATE) 1 once daily Prescriptions: TRILIPIX 135 MG CPDR (CHOLINE FENOFIBRATE) 1 once daily  #180 x 0   Entered by:   Doristine Devoid CMA   Authorized by:   Marga Melnick MD   Signed by:   Doristine Devoid CMA on 08/16/2010   Method used:   [facility]        [facility]  [address] [phone]* (retail)       [address]       [place], [place]  [address]       Ph: [phone] or [phone]       Fax: [phone]   RxID:   [id number]

## 2011-01-14 NOTE — Assessment & Plan Note (Signed)
[facility]                        [place] OFFICE NOTE   FINAS, DELONE                    MRN:          [id number]  DATE:07/27/2006                            DOB:          04/12/1941    Chris Burton was seen July 27, 2006, for refill of his  probenecid.  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   He questions whether he might have sleep apnea, based on his wife's  description.   He previously was no Niacin and fish oil but stopped this because of  some optic nerve issue.  He states that an MRI analysis was done in  [place] to evaluate his ophthalmologic problems.  He was diagnosed as  having inflammation of the muscle next to the optic nerve which  manifested itself as acute pain.  Apparently, according to  ophthalmologic literature, this might be related to Niacin or Omega 3  fatty acids.   He states that he jolts himself awake from a deep sleep.  He denies  any daytime somnolence or motor vehicle accidents.  He wakes up 2-3  times per night.   His PSA is being monitored by Dr. Cindie Crumbly in [place], [place].  He  received 23 external beam radiation treatments in 1998 plus 60 palladium  seed implants.  Colonoscopy in 2004 revealed tiny polyps; repeat is due  in between 2007 and 2009.  He was found to have radiation proctitis at  that time.  He has a past history of simplex dermatitis and Hepatitis A.  He has also had basal cell cancer, dyslipidemia and gout.   HE IS INTOLERANT OR ALLERGIC TO METHYLPARABEN, ALLOPURINOL (CAUSED  CAPILLARY FRAGILITY).  BANANAS.   Weight was stable, pulse was 60 and blood pressure 116/70. THYROID:  Was  firm without nodularity.  The remainder of the exam was unremarkable.  GENITOURINARY EXAM:  Was deferred to Dr. Cindie Crumbly.   An NMR LipoProfile has been recommended to evaluate  his dyslipidemia.   I have recommendation an annual ophthalmologic exam.   Split night sleep study was declined today by Dr. Sol Blazing.  A glucose,  BUN, SGOT and SGPT were normal.  His creatinine is 1.8; it was 1.4 in  November of 2006.  Uric acid has risen from 5.5 to 7.7.  It is unclear  whether he received a contrast agent with the MRI, which might explain  this.  He works in [place] in high temperatures and is at risk for  dehydration.  At this time his BUN is normal at 18.  Forced oral  hydration to at least 40 ounces of water daily would be recommended.   The probenecid, which is 500 mg two tablets daily, should be kept at the  lowest possible dose to control gout and should not be taken at a dose  of higher than 1 gm.  Purine restriction should be continued.   A Nephrology consultation would be recommended because of a significant  change in his creatinine from November, 2006 to  November, 2007.     Titus Dubin. Alwyn Ren, MD,FACP,FCCP  Electronically Signed    [facility]  DD: 08/03/2006  DT: 08/03/2006  Job #: [id number]   cc:   Joelene Millin

## 2011-06-10 LAB — DIFFERENTIAL
Eosinophils Relative: 0
Lymphocytes Relative: 10 — ABNORMAL LOW
Lymphs Abs: 0.9
Lymphs Abs: 1.6
Monocytes Absolute: 1.1 — ABNORMAL HIGH
Monocytes Relative: 7
Neutro Abs: 8.3 — ABNORMAL HIGH
Neutrophils Relative %: 77

## 2011-06-10 LAB — CBC
HCT: 46.5
Hemoglobin: 16.3
MCHC: 34.8
MCHC: 35
MCV: 92.7
MCV: 92.2
Platelets: 137 — ABNORMAL LOW
RDW: 12.9

## 2011-06-10 LAB — URINALYSIS, ROUTINE W REFLEX MICROSCOPIC
Bilirubin Urine: NEGATIVE
Bilirubin Urine: NEGATIVE
Glucose, UA: NEGATIVE
Ketones, ur: NEGATIVE
Ketones, ur: NEGATIVE
Protein, ur: NEGATIVE
Urobilinogen, UA: 0.2
pH: 6.5

## 2011-06-10 LAB — URINE CULTURE
Colony Count: NO GROWTH
Culture: NO GROWTH

## 2011-06-10 LAB — COMPREHENSIVE METABOLIC PANEL
AST: 20
Albumin: 3.3 — ABNORMAL LOW
BUN: 19
Calcium: 8.6
Calcium: 9.7
Creatinine, Ser: 3.24 — ABNORMAL HIGH
Creatinine, Ser: 1.79 — ABNORMAL HIGH
GFR calc Af Amer: 23 — ABNORMAL LOW
GFR calc non Af Amer: 38 — ABNORMAL LOW
Glucose, Bld: 132 — ABNORMAL HIGH
Total Protein: 5.8 — ABNORMAL LOW
Total Protein: 6.6

## 2011-06-10 LAB — URINE MICROSCOPIC-ADD ON

## 2011-06-20 ENCOUNTER — Telehealth: Payer: Self-pay | Admitting: Internal Medicine

## 2011-06-24 ENCOUNTER — Telehealth: Payer: Self-pay | Admitting: Internal Medicine

## 2011-06-24 MED ORDER — PROBENECID 500 MG PO TABS: 500.0000 mg | ORAL_TABLET | Freq: Two times a day (BID) | ORAL | Status: DC

## 2011-06-24 NOTE — Telephone Encounter (Signed)
RX sent

## 2011-06-24 NOTE — Telephone Encounter (Signed)
Patient wants refill & would like addl refill till his appt in dec Probenecid 500mg  2 tab each day  [facility] [address]

## 2011-08-19 ENCOUNTER — Encounter: Payer: Self-pay | Admitting: Internal Medicine

## 2011-08-22 ENCOUNTER — Encounter: Payer: Self-pay | Admitting: Internal Medicine

## 2011-08-22 ENCOUNTER — Ambulatory Visit (INDEPENDENT_AMBULATORY_CARE_PROVIDER_SITE_OTHER): Payer: BC Managed Care – PPO | Admitting: Internal Medicine

## 2011-08-22 VITALS — BP 114/68 | HR 68 | Temp 97.5°F | Resp 12 | Ht 72.0 in | Wt 202.8 lb

## 2011-08-22 DIAGNOSIS — R030 Elevated blood-pressure reading, without diagnosis of hypertension: Secondary | ICD-10-CM

## 2011-08-22 DIAGNOSIS — Z0000 Encounter for general adult medical examination without abnormal findings: Secondary | ICD-10-CM

## 2011-08-22 DIAGNOSIS — E782 Mixed hyperlipidemia: Secondary | ICD-10-CM

## 2011-08-22 DIAGNOSIS — E559 Vitamin D deficiency, unspecified: Secondary | ICD-10-CM

## 2011-08-22 NOTE — Assessment & Plan Note (Signed)
BP has been well controlled w/o meds; it is usually 120/80 as per home records. EKG is normal; no HTN changes present.

## 2011-08-22 NOTE — Patient Instructions (Signed)
Follow a  low carb nutrition program such as [place] or [employer]  to prevent Diabetes  . White carbohydrates (potatoes, rice, bread, and pasta) have a high spike of sugar and a high load of sugar. For example a  baked potato has a cup of sugar and a  french fry  2 teaspoons of sugar. Yams, wild  rice, whole grained bread &  wheat pasta have been much lower spike and load of  sugar. Portions should be the size of a deck of cards or your palm. The most common cause of elevated triglycerides is the ingestion of sugar from high fructose corn syrup sources added to processed foods & drinks.  Eat a low-fat diet with lots of fruits and vegetables, up to 7-9 servings per day. Consume less than 40 grams of sugar per day from foods & drinks with High Fructose Corn Syrup (HFCS) sugar as #1,2,3 or # 4 on label.Whole Foods, [employer] & [employer] do not carry products with HFCS. Follow a  low carb nutrition program such as [place] or [employer]  to prevent Diabetes progression . White carbohydrates (potatoes, rice, bread, and pasta) have a high spike of sugar and a high load of sugar. For example a  baked potato has a cup of sugar and a  french fry  2 teaspoons of sugar. Yams, wild  rice, whole grained bread &  wheat pasta have been much lower spike and load of  sugar. Portions should be the size of a deck of cards or your palm.   As Triglycerides go up, HDL or good cholesterol goes down. Also uric acid which causes gout will go up.

## 2011-08-22 NOTE — Assessment & Plan Note (Signed)
Dr Rosey Bath D/Ced vitamin D supplement due to renal insufficiency

## 2011-08-22 NOTE — Progress Notes (Signed)
Subjective:    Patient ID: Chris Burton, male    DOB: 09-18-1940, 70 y.o.   MRN: [id number]  HPI Medicare Wellness Visit:  The following psychosocial & medical history were reviewed as required by Medicare.   Social history: caffeine: 2 cups coffee/ day , alcohol:  no ,  tobacco use : quit 1962  & exercise : 45 min daily & 90 min in gym 3 X/ week .   Home & personal  safety / fall risk: no issues, activities of daily living: no limitations , seatbelt use : yes , and smoke alarm employment : yes .  Power of Attorney/Living Will status : in place  Vision ( as recorded per Nurse) & Hearing  evaluation :  Ophth exam 2011. Wall chart read & whisper heard @ 6 ft Orientation :oriented X 3 , memory & recall :good, spelling  testing: good,and mood & affect : normal . Depression / anxiety: no issues Travel history : in [place] up to 8 months/ year , immunization status :? Shingles needed , transfusion history:  no, and preventive health surveillance ( colonoscopy, BMD , etc as per protocol/ [facility]): colonoscopy up to date, Dental care:  Seen @ least annually . Chart reviewed &  Updated. Active issues reviewed & addressed.       Review of Systems Dyslipidemia assessment: Prior Advanced Lipid Testing: NMR Lipoprofile.   Family history of premature CAD/ MI: no .  Nutrition: heart healthy .  Diabetes : no . HTN: BP intermittently rarely. Weight :  Essentially stable. Med compliance : not on statin ; taking supplement. Note: Glucosamine started recently ROS: fatigue: no ; chest pain : no ;claudication: no; palpitations: no; abd pain/bowel changes: some constipation ; myalgias:no;  syncope : no ; memory loss: no;skin changes: no. Lab results reviewed :LDL 98; TG 241 @ [facility] 10/12     Objective:   Physical Exam Gen.: Healthy and well-nourished in appearance. Alert, appropriate and cooperative throughout exam. Head: Normocephalic without obvious abnormalities;  pattern alopecia  Eyes: No corneal or  conjunctival inflammation noted. Pupils equal round reactive to light and accommodation. Fundal exam is benign without hemorrhages, exudate, papilledema. Extraocular motion intact. Vision grossly normal. Ears: External  ear exam reveals no significant lesions or deformities. Canals: wax R > L. Nose: External nasal exam reveals no deformity or inflammation. Nasal mucosa are pink and moist. No lesions or exudates noted.  Mouth: Oral mucosa and oropharynx reveal no lesions or exudates. Teeth in good repair. Neck: No deformities, masses, or tenderness noted. Range of motion & . Thyroid normal. Lungs: Normal respiratory effort; chest expands symmetrically. Lungs are clear to auscultation without rales, wheezes, or increased work of breathing. Heart: Normal rate and rhythm. Normal S1 and S2. No gallop, click, or rub. S 4 w/o  murmur. Abdomen: Bowel sounds normal; abdomen soft and nontender. No masses, organomegaly or hernias noted. Genitalia/ DRE : Dr Cindie Crumbly, [place], [place]   .                                                                                   Musculoskeletal/extremities: No deformity or scoliosis noted of  the thoracic or lumbar  spine. No clubbing, cyanosis, edema, or deformity noted. Range of motion  normal .Tone & strength  normal.Joints normal. Nail health  good. Vascular: Carotid, radial artery, dorsalis pedis and  posterior tibial pulses are full and equal. No bruits present. Neurologic: Alert and oriented x3. Deep tendon reflexes symmetrical and normal.          Skin: Intact without suspicious lesions or rashes. Lymph: No cervical, axillary lymphadenopathy present. Psych: Mood and affect are normal. Normally interactive                                                                                         Assessment & Plan:  #1 Medicare Wellness Exam; criteria met ; data entered #2 Problem List reviewed ; Assessment/ Recommendations made Plan: see Orders

## 2011-08-22 NOTE — Assessment & Plan Note (Signed)
TG elevated ; LDL @ goal of < 100. Nutritional interventions discussed

## 2011-08-29 ENCOUNTER — Encounter: Payer: Self-pay | Admitting: Internal Medicine

## 2011-09-12 ENCOUNTER — Other Ambulatory Visit: Payer: Self-pay | Admitting: Internal Medicine

## 2012-02-28 IMAGING — CT CT ABD-PELV W/O CM
2 of 4 series · 17 of 46 positions shown, 19 images · non-contrast
Comparison: 04/27/2007

CLINICAL DATA: Right flank pain.  History nephrolithiasis.

CT ABDOMEN AND PELVIS WITHOUT CONTRAST
TECHNIQUE: Multidetector CT imaging of the abdomen and pelvis was
performed following the standard protocol without intravenous
contrast.

[Series 2: stone_wo 5.0 b40f st · axial · 0.79mm/px · z∈[-512,-106]mm · 14 of 89 slices shown, 16 images]
[im 4/89  soft-tissue]
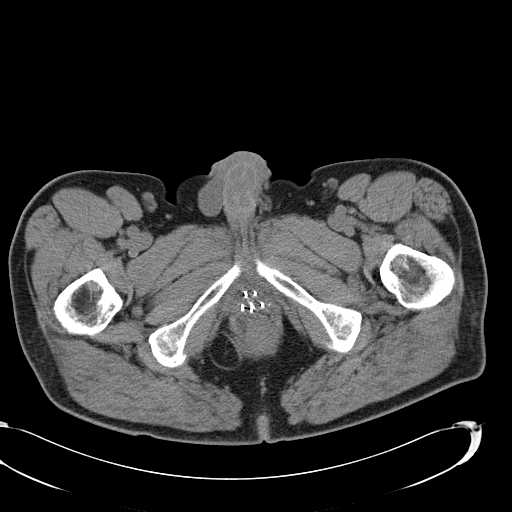
[im 4/89  bone]
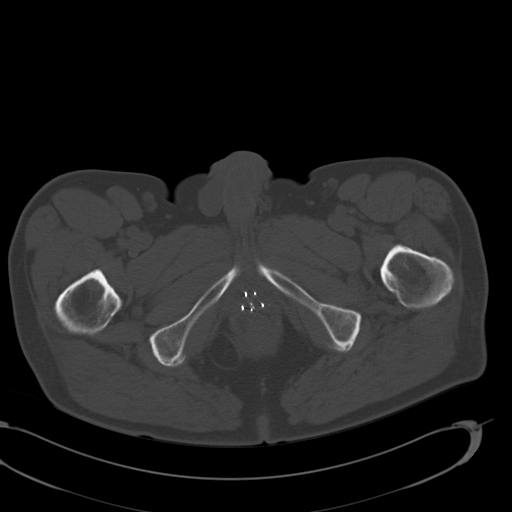
[im 11/89  soft-tissue]
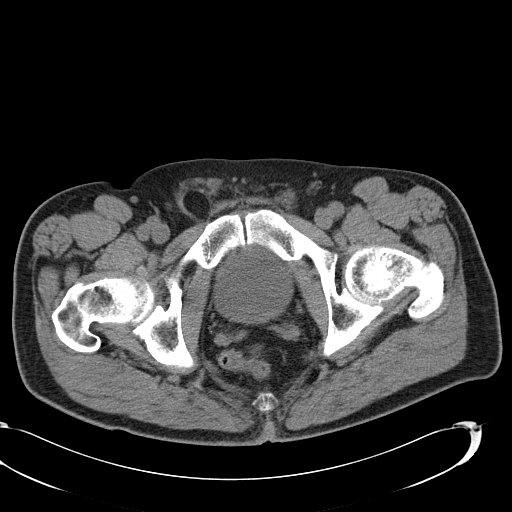
[im 18/89  soft-tissue]
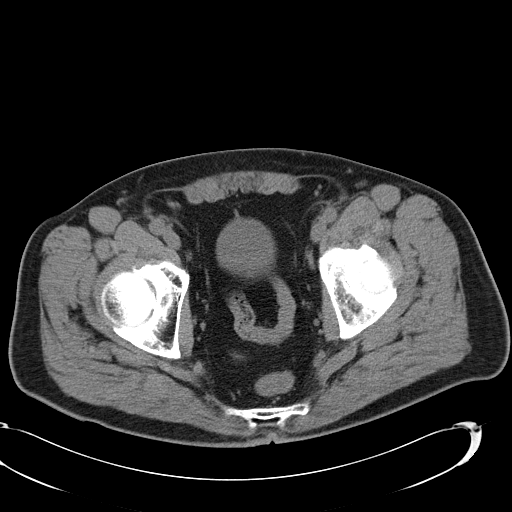
[im 25/89  soft-tissue]
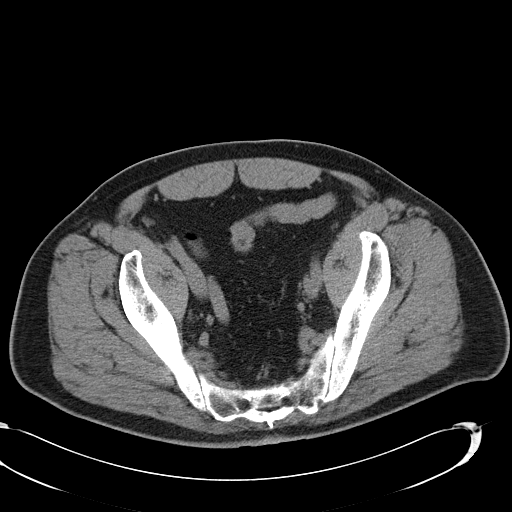
[im 29/89  soft-tissue]
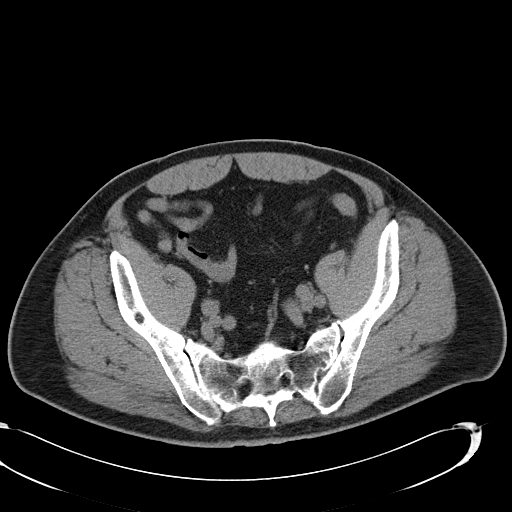
[im 36/89  soft-tissue]
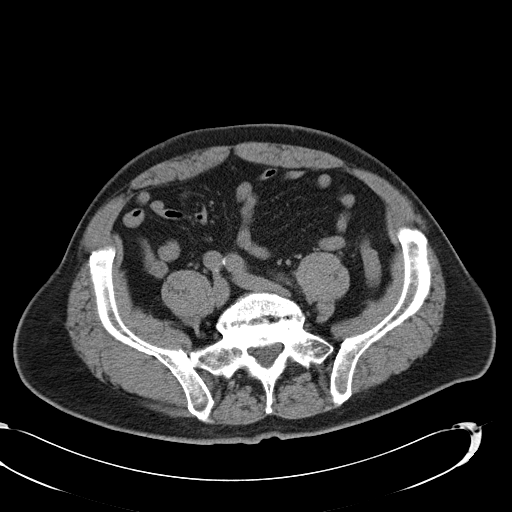
[im 43/89  soft-tissue]
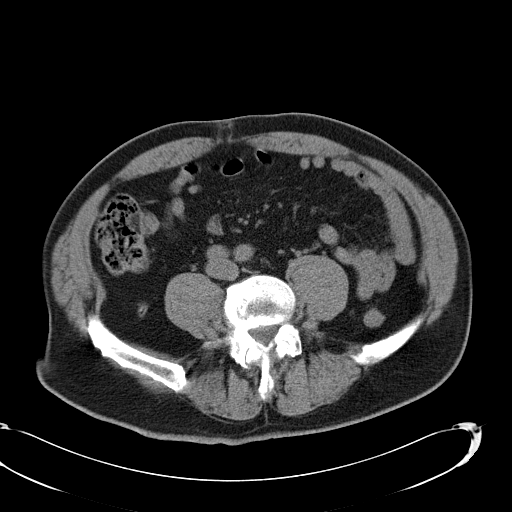
[im 46/89  soft-tissue]
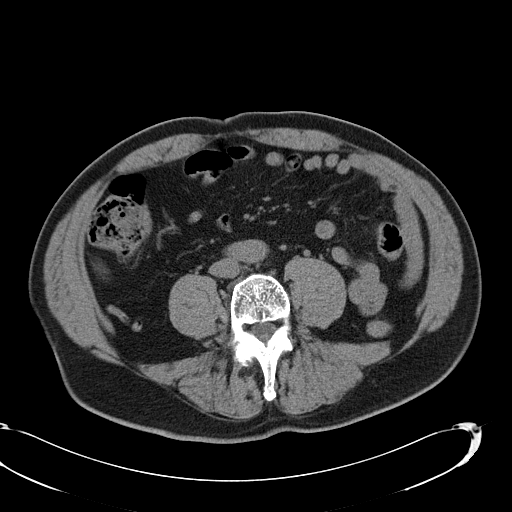
[im 53/89  soft-tissue]
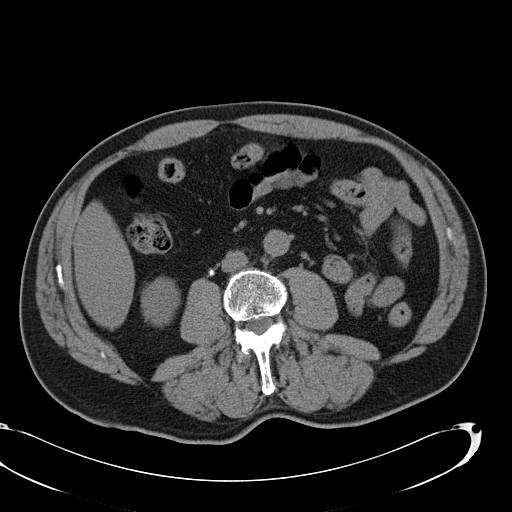
[im 53/89  bone]
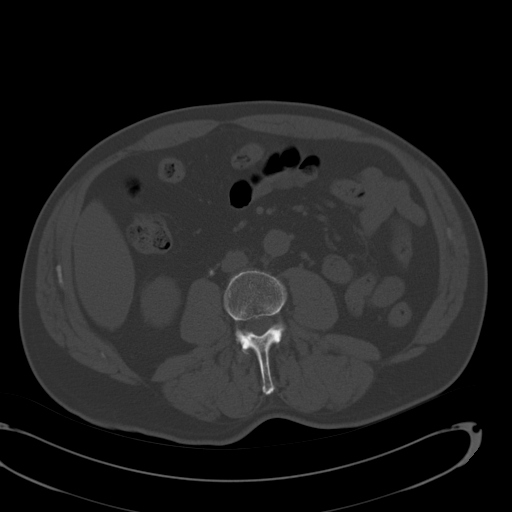
[im 60/89  soft-tissue]
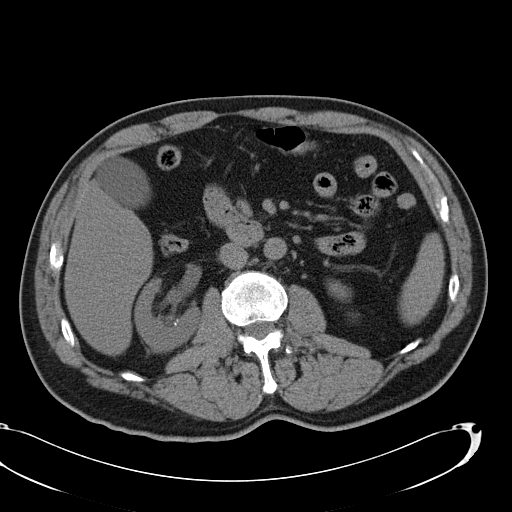
[im 67/89  soft-tissue]
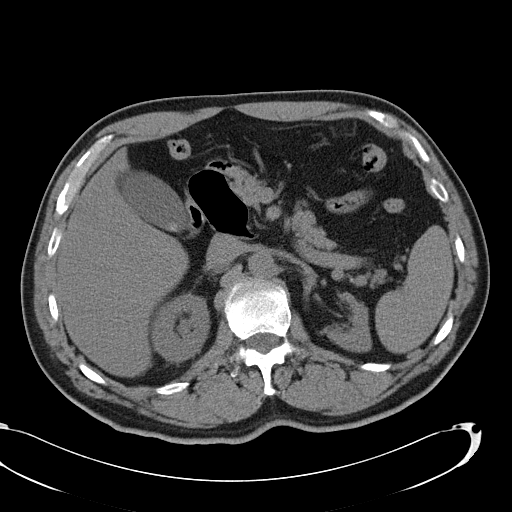
[im 71/89  soft-tissue]
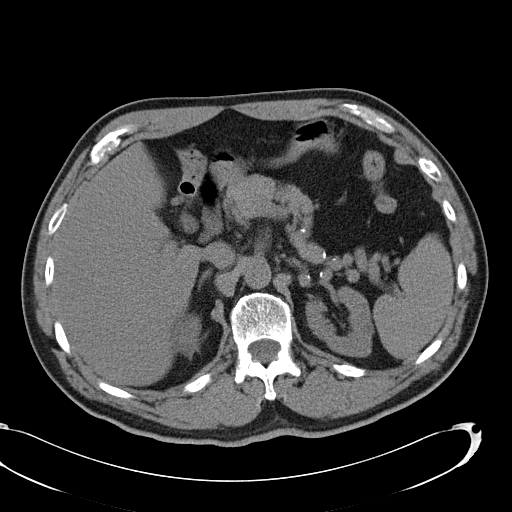
[im 78/89  soft-tissue]
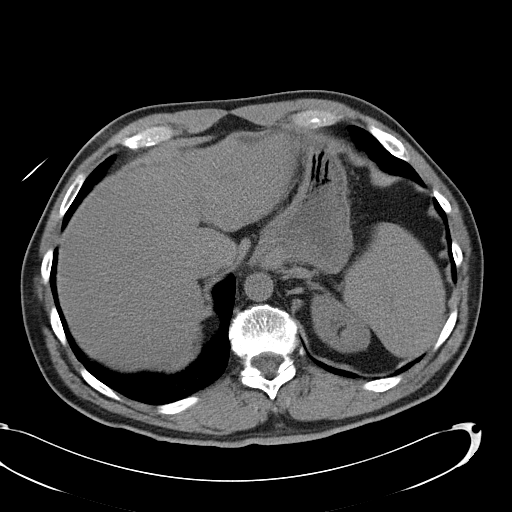
[im 85/89  soft-tissue]
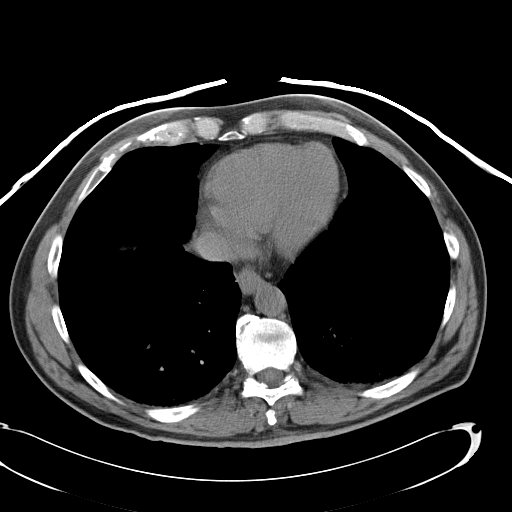

[Series 602: coronal abdomen · coronal · 0.90mm/px · 3 of 131 slices shown]
[im 44/131  soft-tissue]
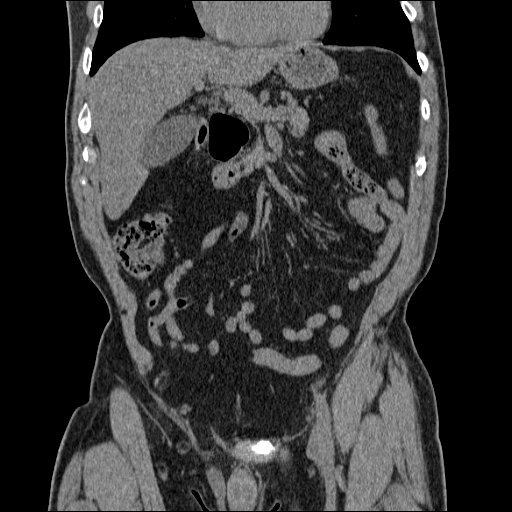
[im 58/131  soft-tissue]
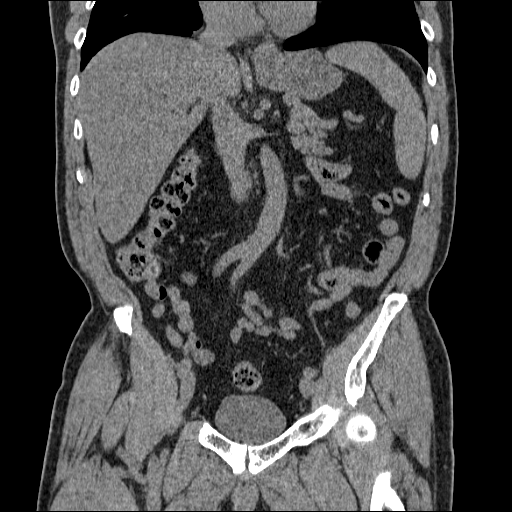
[im 73/131  soft-tissue]
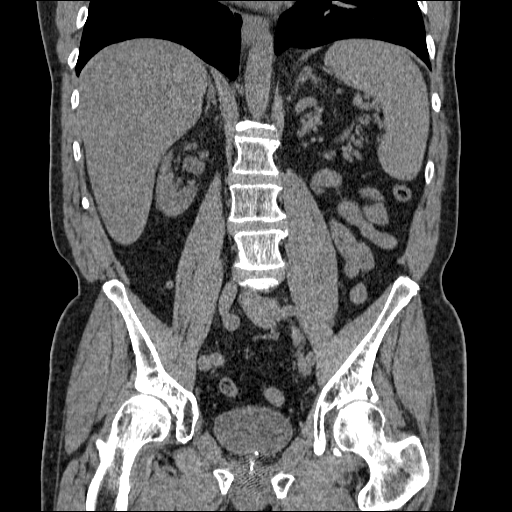

[17 of 46 positions shown; findings below may reference images not displayed]

FINDINGS: There is minimal dependent atelectasis in the visualized
lung bases.  Stable granuloma in the posterior basal segment left
lower lobe.

There is bilateral nephrolithiasis.  1 mm calculi in the upper pole
and interpolar region of the left renal collecting system.  On the
right, multiple calculi, largest stone or cluster in the lower pole
4 mm.  There is mild right hydronephrosis and proximal
ureterectasis to the level of the proximal ureteral calculus
measuring 5 mm diameter, image 38/2. Urinary bladder incompletely
distended.  Multiple metallic seeds in the prostate.

Small punctate splenic granulomas.  Partially calcified stones
noted in the dependent aspect of the gallbladder.  Unremarkable
uninfused evaluation of the liver, adrenal glands, renal
parenchyma, pancreas.  Small hiatal hernia.  Stomach and small
bowel are nondistended.  Normal appendix.  The colon is nondilated,
unremarkable.  No ascites.  No free air.  Small right inguinal
hernia containing only fat.  Facet and disc   degenerative change
in the lower lumbar spine.
IMPRESSION: 1.  Bilateral nephrolithiasis with an obstructing 5 mm proximal
right ureteral calculus.
2.  Cholelithiasis.
3.  Small hiatal hernia.

## 2012-03-14 NOTE — Telephone Encounter (Signed)
error 

## 2012-08-03 ENCOUNTER — Telehealth: Payer: Self-pay | Admitting: Internal Medicine

## 2012-08-03 NOTE — Telephone Encounter (Signed)
lmovm for pt to call office. °

## 2012-08-03 NOTE — Telephone Encounter (Signed)
Pt scheduled  

## 2012-08-03 NOTE — Telephone Encounter (Signed)
pt failed to schedule CPE when in last year he will be leaving to go to [place] the last week of january would you like to work him in OR is it ok for him to skip this year? Offered pt next available 10/02/12, however he will be gone to [place] for 29-months  Cb# [phone]

## 2012-08-03 NOTE — Telephone Encounter (Signed)
Hopp please advise if patient to be worked in for CPX in Jan 2014 ?

## 2012-08-03 NOTE — Telephone Encounter (Signed)
Plan to schedule appt for 09/13/12 @ 1:30p

## 2012-08-03 NOTE — Telephone Encounter (Signed)
See if appt available in Jan ; thanks

## 2012-08-30 ENCOUNTER — Encounter: Payer: Self-pay | Admitting: *Deleted

## 2012-09-05 ENCOUNTER — Encounter: Payer: Self-pay | Admitting: Internal Medicine

## 2012-09-10 ENCOUNTER — Other Ambulatory Visit: Payer: Self-pay | Admitting: Internal Medicine

## 2012-09-11 NOTE — Telephone Encounter (Signed)
Pt is scheduled for CPE this Thursday, 09/13/12.

## 2012-09-11 NOTE — Telephone Encounter (Signed)
OK #90; F/U uric acid needed

## 2012-09-11 NOTE — Telephone Encounter (Signed)
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 No recent Uric Acid level on file   Hopp please advise on refill request

## 2012-09-12 ENCOUNTER — Other Ambulatory Visit: Payer: Self-pay

## 2012-09-12 NOTE — Telephone Encounter (Signed)
Per Dr.Hopper will address at pending appointment tomorrow

## 2012-09-12 NOTE — Telephone Encounter (Signed)
1.) Patient would like a 7 month supply at one time for Probenecid, patient will soon travel to [place] #420 pills  2.) Pharmacy called patient would like rx for acyclovir ointment and pills (orignal prescriber requested patient be seen in order to refill). Patient would like to know if Dr.Hopper will RX these for him  FYI: Patient with pending appointment tomorrow   Hopp please advise on patient's request

## 2012-09-13 ENCOUNTER — Ambulatory Visit (INDEPENDENT_AMBULATORY_CARE_PROVIDER_SITE_OTHER): Payer: Medicare Other | Admitting: Internal Medicine

## 2012-09-13 ENCOUNTER — Encounter: Payer: Self-pay | Admitting: Internal Medicine

## 2012-09-13 VITALS — BP 116/80 | HR 90 | Temp 97.8°F | Resp 12 | Ht 71.0 in | Wt 202.0 lb

## 2012-09-13 DIAGNOSIS — N259 Disorder resulting from impaired renal tubular function, unspecified: Secondary | ICD-10-CM

## 2012-09-13 DIAGNOSIS — E785 Hyperlipidemia, unspecified: Secondary | ICD-10-CM

## 2012-09-13 DIAGNOSIS — Z23 Encounter for immunization: Secondary | ICD-10-CM

## 2012-09-13 DIAGNOSIS — D126 Benign neoplasm of colon, unspecified: Secondary | ICD-10-CM

## 2012-09-13 DIAGNOSIS — Z0000 Encounter for general adult medical examination without abnormal findings: Secondary | ICD-10-CM

## 2012-09-13 MED ORDER — PROBENECID 500 MG PO TABS: 500.0000 mg | ORAL_TABLET | Freq: Two times a day (BID) | ORAL | Status: DC

## 2012-09-13 MED ORDER — TRAMADOL HCL 50 MG PO TABS: 50.0000 mg | ORAL_TABLET | Freq: Three times a day (TID) | ORAL | Status: DC | PRN

## 2012-09-13 MED ORDER — ACYCLOVIR 400 MG PO TABS: ORAL_TABLET | ORAL | Status: DC

## 2012-09-13 MED ORDER — ACYCLOVIR 5 % EX OINT: TOPICAL_OINTMENT | CUTANEOUS | Status: DC

## 2012-09-13 NOTE — Patient Instructions (Addendum)
The most common cause of elevated triglycerides AND uric acid  is the ingestion of sugar from high fructose corn syrup sources added to processed foods & drinks.  Eat a low-fat diet with lots of fruits and vegetables, up to 7-9 servings per day. Consume less than 40 ( preferably ZERO) grams of sugar per day from foods & drinks with High Fructose Corn Syrup (HFCS) sugar as #1,2,3 or # 4 on label.Whole Foods, [employer] & [employer] do not carry products with HFCS. Risk of premature heart attack or stroke increases as LDL or BAD cholesterol rises, especially if there is family history of heart attack in males before 61 or women before 38. Based on your prior advanced testing, your LDL goal is < 100 , ideally < 70. Your present LDL increases long term heart attack or stroke risk 36 %.The best dietary  information on cholesterol is Dr Gildardo Griffes book Eat, Drink & Be Healthy. As per the Standard of Care , screening Colonoscopy recommended @ 50 & every 5-10 years thereafter . More frequent monitor would be dictated by family history or findings @ Colonoscopy  If you activate My Chart; the results can be released to you as soon as they populate from the lab. If you choose not to use this program; the labs have to be reviewed, copied & mailed   causing a delay in getting the results to you.

## 2012-09-13 NOTE — Progress Notes (Signed)
Subjective:    Patient ID: Chris Burton, male    DOB: 1941-01-12, 72 y.o.   MRN: [id number]  HPI Medicare Wellness Visit:  The following psychosocial & medical history were reviewed as required by Medicare.   Social history: caffeine: occasional tea , alcohol:  rarely ,  tobacco use : quit 1962  & exercise : 45 min daily as [employer].   Home & personal  safety / fall risk: no issues, activities of daily living:no limitations , seatbelt use : yes , and smoke alarm employment : yes .  Power of Attorney/Living Will status : in place  Vision ( as recorded per Nurse) & Hearing  evaluation : Ophth exam & hearing exam not current. Orientation :oriented X3 , memory & recall :good, spelling or math testing: good,and mood & affect : normal . Depression / anxiety: denied  Travel history : frequently to [place]  , immunization status :tetanus due , transfusion history:  no, and preventive health surveillance ( colonoscopies, BMD , etc as per protocol/ [facility]): see below, Dental care:  annually . Chart reviewed &  Updated. Active issues reviewed & addressed.      Past medical history/family history/social history were all reviewed and updated. Pertinent data: Lab studies were completed at [facility] 06/29/12. Platelet count was minimally reduced at 156,000. Creatinine was 1.57. Triglycerides were 179; HDL 30; LDL 136. Uric acid was 8.2. Vitamin D level was 31.4.  Urologic followup was 07/16/12 in [place]. PSA was 0.008. He had the prostate seeding over 12 years ago.  Has been  notified that followup colonoscopy is due. He has had adenomatous polyps.      Review of Systems  He denies abdominal pain, unexplained weight loss, melena, rectal bleeding, or persistently small stools. He does have some chronic constipation.    Objective:   Physical Exam Gen.:  well-nourished in appearance. Alert, appropriate and cooperative throughout exam.   Head: Normocephalic without obvious  abnormalities;  moustache  Eyes: No corneal or conjunctival inflammation noted. Pupils equal round reactive to light and accommodation. Fundal exam is benign without hemorrhages, exudate, papilledema. Extraocular motion intact. Vision grossly normal. Ears: External  ear exam reveals no significant lesions or deformities. Canals clear .TMs normal. Hearing is grossly normal bilaterally. Nose: External nasal exam reveals no deformity or inflammation. Nasal mucosa are pink and moist. No lesions or exudates noted.   Mouth: Oral mucosa and oropharynx reveal no lesions or exudates. Teeth in good repair. Neck: No deformities, masses, or tenderness noted. Range of motion & Thyroid normal. Lungs: Normal respiratory effort; chest expands symmetrically. Lungs are clear to auscultation without rales, wheezes, or increased work of breathing. Heart: Normal rate and rhythm. Normal S1 and S2. No gallop, click, or rub. Grade 1/2- 1/6 systolic murmur R base Abdomen: Bowel sounds normal; abdomen soft and nontender. No masses, organomegaly or hernias noted. Genitalia: Dr Charlett Blake                                  Musculoskeletal/extremities: No deformity or scoliosis noted of  the thoracic or lumbar spine. No clubbing, cyanosis, edema, or significant extremity  deformity noted. Range of motion normal .Tone & strength  normal.Joints normal . Nail health good. Able to lie down & sit up w/o help. Negative SLR bilaterally Vascular: Carotid, radial artery, dorsalis pedis and  posterior tibial pulses are full and equal. No bruits present. Neurologic: Alert  and oriented x3. Deep tendon reflexes symmetrical and normal.  Skin: Faint vasculitis which blanches with pressure Lymph: No cervical, axillary  lymphadenopathy present. Psych: Mood and affect are normal. Normally interactive                                                                                         Assessment & Plan:  #1 Medicare Wellness Exam; criteria  met ; data entered #2 Problem List reviewed ; Assessment/ Recommendations made Plan: see Orders

## 2012-09-17 ENCOUNTER — Other Ambulatory Visit: Payer: Self-pay

## 2012-09-17 MED ORDER — TRAMADOL HCL 50 MG PO TABS: 50.0000 mg | ORAL_TABLET | Freq: Three times a day (TID) | ORAL | Status: DC | PRN

## 2012-09-17 MED ORDER — ACYCLOVIR 400 MG PO TABS: ORAL_TABLET | ORAL | Status: DC

## 2012-09-17 MED ORDER — ACYCLOVIR 5 % EX OINT: TOPICAL_OINTMENT | CUTANEOUS | Status: DC

## 2012-09-17 MED ORDER — PROBENECID 500 MG PO TABS: 500.0000 mg | ORAL_TABLET | Freq: Two times a day (BID) | ORAL | Status: DC

## 2013-04-24 ENCOUNTER — Encounter: Payer: Self-pay | Admitting: Internal Medicine

## 2013-05-14 ENCOUNTER — Encounter: Payer: Self-pay | Admitting: Internal Medicine

## 2013-05-22 ENCOUNTER — Other Ambulatory Visit: Payer: Self-pay | Admitting: Internal Medicine

## 2013-05-22 NOTE — Telephone Encounter (Signed)
Letter mailed to pt to make an OV>

## 2013-06-25 ENCOUNTER — Encounter: Payer: Self-pay | Admitting: Internal Medicine

## 2013-07-16 ENCOUNTER — Other Ambulatory Visit: Payer: Self-pay | Admitting: *Deleted

## 2013-07-16 MED ORDER — PROBENECID 500 MG PO TABS: ORAL_TABLET | ORAL | Status: AC

## 2013-07-16 NOTE — Telephone Encounter (Signed)
Probenecid refilled per protocol. OV due

## 2013-08-07 ENCOUNTER — Encounter: Payer: Medicare Other | Admitting: Internal Medicine

## 2013-08-20 ENCOUNTER — Telehealth: Payer: Self-pay

## 2013-08-20 NOTE — Telephone Encounter (Signed)
OK with me,  But I have lot of openings during my hospital week Jan 19,20. If it is the only reason.

## 2013-08-20 NOTE — Telephone Encounter (Signed)
Pt only wanted to switch from Dr Juanda Chance to Dr Jarold Motto because of schedule conflict.  He is leaving the country until Sept as of Jan 21st and had to get this procedure done before then.

## 2013-08-29 HISTORY — PX: COLONOSCOPY W/ POLYPECTOMY: SHX1380

## 2013-09-03 ENCOUNTER — Encounter: Payer: Medicare Other | Admitting: Internal Medicine

## 2013-09-05 ENCOUNTER — Telehealth: Payer: Self-pay

## 2013-09-05 NOTE — Telephone Encounter (Signed)
Spoke with patient and he would rather have his procedure on 09/09/13.

## 2013-09-05 NOTE — Telephone Encounter (Signed)
Talked with Dr Sharlett Iles.  He is OK with accepting pt if that's what the pt wants to do.

## 2013-09-05 NOTE — Telephone Encounter (Signed)
Please check with pt if he would be OK having procedure at hospital (with Dr Olevia Perches) Jan 19,20 since he doesn't leave country until Jan 21st.  Thank you.

## 2013-09-06 ENCOUNTER — Ambulatory Visit (AMBULATORY_SURGERY_CENTER): Payer: Self-pay | Admitting: *Deleted

## 2013-09-06 VITALS — Ht 71.0 in | Wt 208.6 lb

## 2013-09-06 DIAGNOSIS — Z86010 Personal history of colonic polyps: Secondary | ICD-10-CM

## 2013-09-06 MED ORDER — MOVIPREP 100 G PO SOLR: ORAL | Status: DC

## 2013-09-06 NOTE — Progress Notes (Signed)
No allergies to eggs or soy. No problems with anesthesia.  

## 2013-09-09 ENCOUNTER — Encounter: Payer: Self-pay | Admitting: Gastroenterology

## 2013-09-09 ENCOUNTER — Ambulatory Visit (AMBULATORY_SURGERY_CENTER): Payer: Medicare Other | Admitting: Gastroenterology

## 2013-09-09 VITALS — BP 110/71 | HR 64 | Temp 97.9°F | Resp 16 | Ht 71.0 in | Wt 208.0 lb

## 2013-09-09 DIAGNOSIS — K5730 Diverticulosis of large intestine without perforation or abscess without bleeding: Secondary | ICD-10-CM

## 2013-09-09 DIAGNOSIS — Z86010 Personal history of colonic polyps: Secondary | ICD-10-CM

## 2013-09-09 DIAGNOSIS — D126 Benign neoplasm of colon, unspecified: Secondary | ICD-10-CM

## 2013-09-09 MED ORDER — SODIUM CHLORIDE 0.9 % IV SOLN: 500.0000 mL | INTRAVENOUS | Status: DC

## 2013-09-09 NOTE — Progress Notes (Signed)
Called to room to assist during endoscopic procedure.  Patient ID and intended procedure confirmed with present staff. Received instructions for my participation in the procedure from the performing physician.  

## 2013-09-09 NOTE — Patient Instructions (Signed)
YOU HAD AN ENDOSCOPIC PROCEDURE TODAY AT THE [facility]: Refer to the procedure report that was given to you for any specific questions about what was found during the examination.  If the procedure report does not answer your questions, please call your gastroenterologist to clarify.  If you requested that your care partner not be given the details of your procedure findings, then the procedure report has been included in a sealed envelope for you to review at your convenience later.  YOU SHOULD EXPECT: Some feelings of bloating in the abdomen. Passage of more gas than usual.  Walking can help get rid of the air that was put into your GI tract during the procedure and reduce the bloating. If you had a lower endoscopy (such as a colonoscopy or flexible sigmoidoscopy) you may notice spotting of blood in your stool or on the toilet paper. If you underwent a bowel prep for your procedure, then you may not have a normal bowel movement for a few days.  DIET: Your first meal following the procedure should be a light meal and then it is ok to progress to your normal diet.  A half-sandwich or bowl of soup is an example of a good first meal.  Heavy or fried foods are harder to digest and may make you feel nauseous or bloated.  Likewise meals heavy in dairy and vegetables can cause extra gas to form and this can also increase the bloating.  Drink plenty of fluids but you should avoid alcoholic beverages for 24 hours.  ACTIVITY: Your care partner should take you home directly after the procedure.  You should plan to take it easy, moving slowly for the rest of the day.  You can resume normal activity the day after the procedure however you should NOT DRIVE or use heavy machinery for 24 hours (because of the sedation medicines used during the test).    SYMPTOMS TO REPORT IMMEDIATELY: A gastroenterologist can be reached at any hour.  During normal business hours, 8:30 AM to 5:00 PM Monday through Friday,  call [phone].  After hours and on weekends, please call the GI answering service at [phone] who will take a message and have the physician on call contact you.   Following lower endoscopy (colonoscopy or flexible sigmoidoscopy):  Excessive amounts of blood in the stool  Significant tenderness or worsening of abdominal pains  Swelling of the abdomen that is new, acute  Fever of 100F or higher   FOLLOW UP: If any biopsies were taken you will be contacted by phone or by letter within the next 1-3 weeks.  Call your gastroenterologist if you have not heard about the biopsies in 3 weeks.  Our staff will call the home number listed on your records the next business day following your procedure to check on you and address any questions or concerns that you may have at that time regarding the information given to you following your procedure. This is a courtesy call and so if there is no answer at the home number and we have not heard from you through the emergency physician on call, we will assume that you have returned to your regular daily activities without incident.  SIGNATURES/CONFIDENTIALITY: You and/or your care partner have signed paperwork which will be entered into your electronic medical record.  These signatures attest to the fact that that the information above on your After Visit Summary has been reviewed and is understood.  Full responsibility of the confidentiality of   this discharge information lies with you and/or your care-partner.   Resume medications. Information given on polyps,diverticulosis and high fiber diet with discharge instructions. 

## 2013-09-09 NOTE — Progress Notes (Signed)
No egg or soy allergy. ewm No problems with past sedation. emw 

## 2013-09-09 NOTE — Progress Notes (Signed)
Stable to RR 

## 2013-09-09 NOTE — Progress Notes (Signed)
Called to procedure room post procedure, location and removal type confirmed with Patsi Sears Endo Tech, per Dr Buel Ream instruction.

## 2013-09-09 NOTE — Op Note (Signed)
[place]  [address] [place], [id number]   COLONOSCOPY PROCEDURE REPORT  PATIENT: Chris Burton, Chris Burton  MR#: [id number] BIRTHDATE: 1941-08-01 , 72  yrs. old GENDER: Male ENDOSCOPIST: Sable Feil, MD, [facility] REFERRED BY: PROCEDURE DATE:  09/09/2013 PROCEDURE:   Colonoscopy with snare polypectomy History of Adenoma - Now for follow-up colonoscopy & has been > or = to 3 yrs.  Yes hx of adenoma.  Has been 3 or more years since last colonoscopy.  Polyps Removed Today? Yes. ASA CLASS:   Class II INDICATIONS:Patient's personal history of adenomatous colon polyps.  MEDICATIONS: propofol (Diprivan) 350mg  IV  DESCRIPTION OF PROCEDURE:   After the risks benefits and alternatives of the procedure were thoroughly explained, informed consent was obtained.  A digital rectal exam revealed no abnormalities of the rectum.   The LB [id number]  endoscope was introduced through the anus and advanced to the cecum, which was identified by both the appendix and ileocecal valve. No adverse events experienced.   Limited by a tortuous and redundant colon. The quality of the prep was excellent, using MoviPrep  The instrument was then slowly withdrawn as the colon was fully examined.      COLON FINDINGS: Two firm and smooth sessile polyps ranging between 3-37mm in size were found in the cecuum and ascending colon.  A polypectomy was performed using snare cautery.  The resection was complete and the polyp tissue was completely retrieved.   A small polypoid shaped sessile polyp was found in the transverse colon.  A polypectomy was performed using snare cautery.  The resection was complete and the polyp tissue was completely retrieved.   Two smooth and firm sessile polyps ranging between 3-50mm in size were found in the sigmoid colon.  A polypectomy was performed using snare cautery.  The resection was complete and the polyp tissue was completely retrieved.   Moderate  diverticulosis was noted in the sigmoid colon and descending colon.  Retroflexed views revealed no abnormalities. The time to cecum=4 minutes 32 seconds.  Withdrawal time=17 minutes 34 seconds.  The scope was withdrawn and the procedure completed. COMPLICATIONS: There were no complications.  ENDOSCOPIC IMPRESSION: 1.   Two sessile polyps ranging between 3-78mm in size were found; polypectomy was performed using snare cautery 2.   Small sessile polyp was found in the transverse colon; polypectomy was performed using snare cautery 3.   Two sessile polyps ranging between 3-93mm in size were found in the sigmoid colon; polypectomy was performed using snare cautery 4.   Moderate diverticulosis was noted in the sigmoid colon and descending colon..very difficult exam per long and redundant sigmoid colon.  RECOMMENDATIONS: 1.  Continue current medications 2.  Repeat Colonoscopy in 3 years. 3.  High fiber diet with liberal fluid intake.   eSigned:  Sable Feil, MD, [facility] 09/09/2013 2:39 PM   cc:   PATIENT NAME:  Chris Burton, Chris Burton MR#: [id number]

## 2013-09-10 ENCOUNTER — Telehealth: Payer: Self-pay

## 2013-09-10 NOTE — Telephone Encounter (Signed)
  Follow up Call-  Call back number 09/09/2013  Post procedure Call Back phone  # [phone]  Permission to leave phone message Yes     Patient questions:  Do you have a fever, pain , or abdominal swelling? no Pain Score  0 *  Have you tolerated food without any problems? yes  Have you been able to return to your normal activities? yes  Do you have any questions about your discharge instructions: Diet   no Medications  no Follow up visit  no  Do you have questions or concerns about your Care? no  Actions: * If pain score is 4 or above: No action needed, pain <4.

## 2013-09-13 ENCOUNTER — Encounter: Payer: Self-pay | Admitting: Gastroenterology

## 2014-12-30 ENCOUNTER — Encounter: Payer: Self-pay | Admitting: Internal Medicine

## 2015-08-30 HISTORY — PX: CATARACT EXTRACTION W/ INTRAOCULAR LENS IMPLANT: SHX1309

## 2016-06-15 ENCOUNTER — Encounter (HOSPITAL_COMMUNITY): Payer: Self-pay | Admitting: Family Medicine

## 2016-06-15 ENCOUNTER — Inpatient Hospital Stay (HOSPITAL_COMMUNITY)
Admission: EM | Admit: 2016-06-15 | Discharge: 2016-06-17 | DRG: 728 | Disposition: A | Discharge status: Home Health | Payer: Medicare Other | Attending: Internal Medicine | Admitting: Internal Medicine

## 2016-06-15 DIAGNOSIS — Z792 Long term (current) use of antibiotics: Secondary | ICD-10-CM

## 2016-06-15 DIAGNOSIS — Z833 Family history of diabetes mellitus: Secondary | ICD-10-CM

## 2016-06-15 DIAGNOSIS — Z923 Personal history of irradiation: Secondary | ICD-10-CM

## 2016-06-15 DIAGNOSIS — E785 Hyperlipidemia, unspecified: Secondary | ICD-10-CM | POA: Diagnosis present

## 2016-06-15 DIAGNOSIS — K219 Gastro-esophageal reflux disease without esophagitis: Secondary | ICD-10-CM | POA: Diagnosis present

## 2016-06-15 DIAGNOSIS — Z801 Family history of malignant neoplasm of trachea, bronchus and lung: Secondary | ICD-10-CM

## 2016-06-15 DIAGNOSIS — N451 Epididymitis: Principal | ICD-10-CM

## 2016-06-15 DIAGNOSIS — Z8546 Personal history of malignant neoplasm of prostate: Secondary | ICD-10-CM

## 2016-06-15 DIAGNOSIS — Z8042 Family history of malignant neoplasm of prostate: Secondary | ICD-10-CM

## 2016-06-15 DIAGNOSIS — Z800 Family history of malignant neoplasm of digestive organs: Secondary | ICD-10-CM

## 2016-06-15 DIAGNOSIS — N183 Chronic kidney disease, stage 3 unspecified: Secondary | ICD-10-CM

## 2016-06-15 DIAGNOSIS — R319 Hematuria, unspecified: Secondary | ICD-10-CM

## 2016-06-15 DIAGNOSIS — Z86010 Personal history of colonic polyps: Secondary | ICD-10-CM

## 2016-06-15 DIAGNOSIS — Z87442 Personal history of urinary calculi: Secondary | ICD-10-CM | POA: Diagnosis present

## 2016-06-15 DIAGNOSIS — K449 Diaphragmatic hernia without obstruction or gangrene: Secondary | ICD-10-CM | POA: Diagnosis present

## 2016-06-15 DIAGNOSIS — N390 Urinary tract infection, site not specified: Secondary | ICD-10-CM | POA: Diagnosis present

## 2016-06-15 DIAGNOSIS — N50811 Right testicular pain: Secondary | ICD-10-CM | POA: Diagnosis not present

## 2016-06-15 DIAGNOSIS — B965 Pseudomonas (aeruginosa) (mallei) (pseudomallei) as the cause of diseases classified elsewhere: Secondary | ICD-10-CM | POA: Diagnosis present

## 2016-06-15 DIAGNOSIS — Z806 Family history of leukemia: Secondary | ICD-10-CM

## 2016-06-15 DIAGNOSIS — Z87891 Personal history of nicotine dependence: Secondary | ICD-10-CM

## 2016-06-15 DIAGNOSIS — N492 Inflammatory disorders of scrotum: Secondary | ICD-10-CM

## 2016-06-15 DIAGNOSIS — M109 Gout, unspecified: Secondary | ICD-10-CM | POA: Diagnosis present

## 2016-06-15 DIAGNOSIS — G4730 Sleep apnea, unspecified: Secondary | ICD-10-CM | POA: Diagnosis present

## 2016-06-15 LAB — URINALYSIS, ROUTINE W REFLEX MICROSCOPIC
Bilirubin Urine: NEGATIVE
Glucose, UA: NEGATIVE mg/dL
KETONES UR: NEGATIVE mg/dL
NITRITE: NEGATIVE
PH: 7 (ref 5.0–8.0)
Protein, ur: NEGATIVE mg/dL
Specific Gravity, Urine: 1.02 (ref 1.005–1.030)

## 2016-06-15 LAB — COMPREHENSIVE METABOLIC PANEL
ALK PHOS: 72 U/L (ref 38–126)
ALT: 22 U/L (ref 17–63)
ANION GAP: 8 (ref 5–15)
AST: 20 U/L (ref 15–41)
Albumin: 4.1 g/dL (ref 3.5–5.0)
BUN: 22 mg/dL — ABNORMAL HIGH (ref 6–20)
CALCIUM: 9.6 mg/dL (ref 8.9–10.3)
CHLORIDE: 108 mmol/L (ref 101–111)
CO2: 26 mmol/L (ref 22–32)
Creatinine, Ser: 1.49 mg/dL — ABNORMAL HIGH (ref 0.61–1.24)
GFR, EST AFRICAN AMERICAN: 51 mL/min — AB (ref >60)
GFR, EST NON AFRICAN AMERICAN: 44 mL/min — AB (ref >60)
Glucose, Bld: 103 mg/dL — ABNORMAL HIGH (ref 65–99)
Potassium: 5 mmol/L (ref 3.5–5.1)
SODIUM: 142 mmol/L (ref 135–145)
Total Bilirubin: 0.6 mg/dL (ref 0.3–1.2)
Total Protein: 7.5 g/dL (ref 6.5–8.1)

## 2016-06-15 LAB — URINE MICROSCOPIC-ADD ON

## 2016-06-15 NOTE — ED Triage Notes (Signed)
Patient is having left testicle swelling and has a fever (101.0 oral at home). Also, is starting to have increase swelling and soreness in the right testicle. Patient has been seen by urology for the swelling of the testicle but was informed to return to [facility] ED if fever occurred. Pt originally had a kidney stone that got dislodged at the bladder. Pt had a surgery to "open him up".

## 2016-06-15 NOTE — ED Notes (Signed)
Pt stated "I called [facility] and they told me if I started having fever I should come to the ER.  I had a kidney stone, it got blocked, made my left testicle get really, really sore & high swollen.  The soreness went away with the 3rd abx.  Then today, I got soreness in the right testicle."

## 2016-06-16 ENCOUNTER — Encounter (HOSPITAL_COMMUNITY): Payer: Self-pay

## 2016-06-16 ENCOUNTER — Emergency Department (HOSPITAL_COMMUNITY): Payer: Medicare Other

## 2016-06-16 DIAGNOSIS — K219 Gastro-esophageal reflux disease without esophagitis: Secondary | ICD-10-CM | POA: Diagnosis present

## 2016-06-16 DIAGNOSIS — N183 Chronic kidney disease, stage 3 unspecified: Secondary | ICD-10-CM

## 2016-06-16 DIAGNOSIS — E785 Hyperlipidemia, unspecified: Secondary | ICD-10-CM | POA: Diagnosis present

## 2016-06-16 DIAGNOSIS — Z806 Family history of leukemia: Secondary | ICD-10-CM | POA: Diagnosis not present

## 2016-06-16 DIAGNOSIS — Z801 Family history of malignant neoplasm of trachea, bronchus and lung: Secondary | ICD-10-CM | POA: Diagnosis not present

## 2016-06-16 DIAGNOSIS — Z8546 Personal history of malignant neoplasm of prostate: Secondary | ICD-10-CM | POA: Diagnosis not present

## 2016-06-16 DIAGNOSIS — Z923 Personal history of irradiation: Secondary | ICD-10-CM | POA: Diagnosis not present

## 2016-06-16 DIAGNOSIS — Z86010 Personal history of colonic polyps: Secondary | ICD-10-CM | POA: Diagnosis not present

## 2016-06-16 DIAGNOSIS — K449 Diaphragmatic hernia without obstruction or gangrene: Secondary | ICD-10-CM | POA: Diagnosis present

## 2016-06-16 DIAGNOSIS — Z800 Family history of malignant neoplasm of digestive organs: Secondary | ICD-10-CM | POA: Diagnosis not present

## 2016-06-16 DIAGNOSIS — N451 Epididymitis: Secondary | ICD-10-CM | POA: Diagnosis present

## 2016-06-16 DIAGNOSIS — B965 Pseudomonas (aeruginosa) (mallei) (pseudomallei) as the cause of diseases classified elsewhere: Secondary | ICD-10-CM | POA: Diagnosis present

## 2016-06-16 DIAGNOSIS — N50811 Right testicular pain: Secondary | ICD-10-CM | POA: Diagnosis present

## 2016-06-16 DIAGNOSIS — M109 Gout, unspecified: Secondary | ICD-10-CM | POA: Diagnosis present

## 2016-06-16 DIAGNOSIS — Z87891 Personal history of nicotine dependence: Secondary | ICD-10-CM | POA: Diagnosis not present

## 2016-06-16 DIAGNOSIS — N390 Urinary tract infection, site not specified: Secondary | ICD-10-CM | POA: Diagnosis present

## 2016-06-16 DIAGNOSIS — Z8042 Family history of malignant neoplasm of prostate: Secondary | ICD-10-CM | POA: Diagnosis not present

## 2016-06-16 DIAGNOSIS — G4730 Sleep apnea, unspecified: Secondary | ICD-10-CM | POA: Diagnosis present

## 2016-06-16 DIAGNOSIS — M1000 Idiopathic gout, unspecified site: Secondary | ICD-10-CM | POA: Diagnosis not present

## 2016-06-16 DIAGNOSIS — Z833 Family history of diabetes mellitus: Secondary | ICD-10-CM | POA: Diagnosis not present

## 2016-06-16 LAB — CBC WITH DIFFERENTIAL/PLATELET
BASOS ABS: 0 10*3/uL (ref 0.0–0.1)
BASOS PCT: 0 %
EOS ABS: 0 10*3/uL (ref 0.0–0.7)
EOS PCT: 0 %
HCT: 46.3 % (ref 39.0–52.0)
Hemoglobin: 15.9 g/dL (ref 13.0–17.0)
LYMPHS PCT: 10 %
Lymphs Abs: 1.9 10*3/uL (ref 0.7–4.0)
MCH: 32.6 pg (ref 26.0–34.0)
MCHC: 34.3 g/dL (ref 30.0–36.0)
MCV: 94.9 fL (ref 78.0–100.0)
Monocytes Absolute: 1.6 10*3/uL — ABNORMAL HIGH (ref 0.1–1.0)
Monocytes Relative: 8 %
Neutro Abs: 15.6 10*3/uL — ABNORMAL HIGH (ref 1.7–7.7)
Neutrophils Relative %: 82 %
PLATELETS: 222 10*3/uL (ref 150–400)
RBC: 4.88 MIL/uL (ref 4.22–5.81)
RDW: 12.8 % (ref 11.5–15.5)
WBC: 19.1 10*3/uL — AB (ref 4.0–10.5)

## 2016-06-16 LAB — I-STAT CG4 LACTIC ACID, ED: LACTIC ACID, VENOUS: 1.2 mmol/L (ref 0.5–1.9)

## 2016-06-16 MED ORDER — FIBER 0.52 G PO CAPS: ORAL_CAPSULE | Freq: Every day | ORAL | Status: DC

## 2016-06-16 MED ORDER — DEXTROSE 5 % IV SOLN
1.0000 g | Freq: Once | INTRAVENOUS | Status: AC
  Administered 2016-06-16: 1 g via INTRAVENOUS
  Filled 2016-06-16: qty 10

## 2016-06-16 MED ORDER — DEXTROSE 5 % IV SOLN
2.0000 g | Freq: Three times a day (TID) | INTRAVENOUS | Status: DC
  Administered 2016-06-16 – 2016-06-17 (×4): 2 g via INTRAVENOUS
  Filled 2016-06-16 (×6): qty 2

## 2016-06-16 MED ORDER — ENOXAPARIN SODIUM 40 MG/0.4ML ~~LOC~~ SOLN
40.0000 mg | SUBCUTANEOUS | Status: DC
  Administered 2016-06-16 – 2016-06-17 (×2): 40 mg via SUBCUTANEOUS
  Filled 2016-06-16 (×2): qty 0.4

## 2016-06-16 MED ORDER — PIPERACILLIN-TAZOBACTAM 3.375 G IVPB
3.3750 g | Freq: Three times a day (TID) | INTRAVENOUS | Status: DC
  Administered 2016-06-16: 3.375 g via INTRAVENOUS
  Filled 2016-06-16 (×2): qty 50

## 2016-06-16 MED ORDER — PSYLLIUM 95 % PO PACK
1.0000 | PACK | Freq: Every day | ORAL | Status: DC
  Administered 2016-06-16 – 2016-06-17 (×2): 1 via ORAL
  Filled 2016-06-16 (×2): qty 1

## 2016-06-16 MED ORDER — PANTOPRAZOLE SODIUM 40 MG PO TBEC
40.0000 mg | DELAYED_RELEASE_TABLET | Freq: Every day | ORAL | Status: DC
  Administered 2016-06-16 – 2016-06-17 (×2): 40 mg via ORAL
  Filled 2016-06-16 (×2): qty 1

## 2016-06-16 MED ORDER — ACETAMINOPHEN 325 MG PO TABS: 650.0000 mg | ORAL_TABLET | Freq: Four times a day (QID) | ORAL | Status: DC | PRN

## 2016-06-16 MED ORDER — PROBENECID 500 MG PO TABS
500.0000 mg | ORAL_TABLET | Freq: Two times a day (BID) | ORAL | Status: DC
  Administered 2016-06-16 – 2016-06-17 (×3): 500 mg via ORAL
  Filled 2016-06-16 (×4): qty 1

## 2016-06-16 MED ORDER — ACETAMINOPHEN 650 MG RE SUPP: 650.0000 mg | Freq: Four times a day (QID) | RECTAL | Status: DC | PRN

## 2016-06-16 MED ORDER — POLYETHYLENE GLYCOL 3350 17 G PO PACK: 17.0000 g | PACK | Freq: Every day | ORAL | Status: DC | PRN

## 2016-06-16 MED ORDER — VALACYCLOVIR HCL 500 MG PO TABS
500.0000 mg | ORAL_TABLET | Freq: Two times a day (BID) | ORAL | Status: DC
  Administered 2016-06-16 – 2016-06-17 (×2): 500 mg via ORAL
  Filled 2016-06-16 (×3): qty 1

## 2016-06-16 MED ORDER — SODIUM CHLORIDE 0.9 % IV BOLUS (SEPSIS)
500.0000 mL | Freq: Once | INTRAVENOUS | Status: AC
  Administered 2016-06-16: 500 mL via INTRAVENOUS

## 2016-06-16 MED ORDER — HYDROCODONE-ACETAMINOPHEN 5-325 MG PO TABS: 1.0000 | ORAL_TABLET | ORAL | Status: DC | PRN

## 2016-06-16 MED ORDER — ONDANSETRON HCL 4 MG/2ML IJ SOLN: 4.0000 mg | Freq: Three times a day (TID) | INTRAMUSCULAR | Status: AC | PRN

## 2016-06-16 NOTE — Progress Notes (Addendum)
Pharmacy Antibiotic Note  Chris Burton is a 75 y.o. male admitted on 06/15/2016 with cellulitis.  Pharmacy has been consulted for zosyn dosing.  Plan: Zosyn 3.375g IV q8h (4 hour infusion).  Height: 5\' 10"  (177.8 cm) Weight: 189 lb (85.7 kg) IBW/kg (Calculated) : 73  Temp (24hrs), Avg:99.3 F (37.4 C), Min:98.4 F (36.9 C), Max:100.5 F (38.1 C)   Recent Labs Lab 06/15/16 2229 06/16/16 0151  WBC 19.1*  --   CREATININE 1.49*  --   LATICACIDVEN  --  1.20    Estimated Creatinine Clearance: 43.5 mL/min (by C-G formula based on SCr of 1.49 mg/dL (H)).    Allergies  Allergen Reactions  . Fish Oil     In combination with Niacin .REACTION: problems with esophagus; exacerbation of GERD & headache OD retro-orbitally   . Methylparaben Rash    Positive on skin test    Antimicrobials this admission: 10/19 rocephin >>x1 ED 10/19 zosyn >>    Dose adjustments this admission:   Microbiology results:  BCx:   UCx:    Sputum:    MRSA PCR:   Thank you for allowing pharmacy to be a part of this patient's care.  Chris Burton 06/16/2016 5:42 AM

## 2016-06-16 NOTE — ED Notes (Signed)
Called floor in advance of coming. Nurse not available for bedside report.  Printed and wrote out report and handed off to the tech. Reported to charge my actions. Called 5w charge

## 2016-06-16 NOTE — H&P (Addendum)
History and Physical    [name] [id number] DOB: 04-05-1941 DOA: 06/15/2016    PCP: Leamon Arnt, MD  Patient coming from: Home  Chief Complaint: testicular pain  HPI: Chris Burton is a 75 y.o. male with medical history significant of prostate cancer, gout, hyperlipidemia, sleep apnea, chronic kidney disease stage III. Patient presents with fever as high as 101 and testicular pain and swelling. He is been seeing a urologist for urinary issues for the past 3 weeks starting with urinary retention and dysuria. He was prescribed ciprofloxacin without improvement and then went to see [facility] (Dr Tresa Moore) about 2 weeks ago where he states he had blood in his urine and a stone in his bladder noted on ultrasound. Foley catheter was placed which he wore for 9 days and had removed a few days ago. He was switched from Cipro to Bactrim and began to have fevers and left testicular and swelling without pain. A few days ago he was switched from Bactrim to Cefpodoxime and noted improvement in his symptoms until he began having right testicular pain and fever again yesterday afternoon. He is currently not having pain except when he palpates the right testicle/groin. Noted a small amount of blood on a pad that was placed under him yesterday. No h/o GI bleed.   ED Course: Fever 100.5, WBC count 19.1, BUN 22, creatinine 1.49, lactic acid normal at 1.2, UA showing small hemoglobin and small leukocytes too numerous to count WBCs and few bacteria 0-5 squamous epithelial cells CT renal stone study shows bilateral nonobstructive nephrolithiasis, mild hazy inflammatory stranding around seminal vesicles suspected to be related to acute epididymitis as seen on prior ultrasound, small bilateral fat-containing inguinal hernias right larger than left, cholelithiasis Ultrasound: Enlarged and heterogeneous appearance of epididymiti bilaterally suggestive of acute epididymitis, bilateral complex  hydroceles likely reactive   Review of Systems:  Poor appetite- mild weight loss  All other systems reviewed and apart from HPI, are negative.  Past Medical History:  Diagnosis Date  . Arthritis   . Cancer [facility])- seeding and external beam radiation 2000   prostate  . Chronic kidney disease    Dr Felicity Pellegrini, [facility]  . GERD (gastroesophageal reflux disease)   . Gout   . Hepatitis A    PMH  . Hx of adenomatous colonic polyps    Dr Olevia Perches  . Hyperlipidemia    LDL goal = < 100  . Skin cancer    Basal Cell, Dr Sherrye Payor  . Sleep apnea     Past Surgical History:  Procedure Laterality Date  . COLONOSCOPY W/ POLYPECTOMY  2004   Dr Olevia Perches  . CYSTOSCOPY  2010   to remove kidney stone  . ESOPHAGEAL DILATION  2007   Dr Olevia Perches  . EXTERNAL BEAM RADIATION     & SEED IMPLANTS FOR RPOSTATE CANCER  . TONSILLECTOMY      Social History:   reports that he quit smoking about 55 years ago. He has never used smokeless tobacco. He reports that he does not drink alcohol or use drugs.  Allergies  Allergen Reactions  . Fish Oil     In combination with Niacin .REACTION: problems with esophagus; exacerbation of GERD & headache OD retro-orbitally   . Methylparaben Rash    Positive on skin test    Family History  Problem Relation Age of Onset  . Lung cancer Mother     SMOKER  . Prostate cancer Father   . Stomach cancer Sister   .  Leukemia Paternal Grandmother   . Prostate cancer Paternal Grandfather   . Diabetes Maternal Grandmother   . Heart disease Neg Hx   . Kidney disease Neg Hx   . Stroke Neg Hx   . Colon cancer Neg Hx      Prior to Admission medications   Medication Sig Start Date End Date Taking? Authorizing Provider  B Complex-C (SUPER B COMPLEX PO) Take by mouth daily.   Yes Historical Provider, MD  cefpodoxime (VANTIN) 200 MG tablet Take 200 mg by mouth daily. 06/09/16  Yes Historical Provider, MD  esomeprazole (NEXIUM) 40 MG capsule Take 40 mg by mouth daily before  breakfast.   Yes Historical Provider, MD  ibuprofen (ADVIL,MOTRIN) 200 MG tablet Take 400 mg by mouth every 6 (six) hours as needed for moderate pain.   Yes Historical Provider, MD  probenecid (BENEMID) 500 MG tablet TAKE 1 TABLET TWICE A DAY 07/16/13  Yes Hendricks Limes, MD  Psyllium (FIBER) 0.52 G CAPS Take by mouth daily.     Yes Historical Provider, MD  acyclovir ointment (ZOVIRAX) 5 % Apply topically every 3 (three) hours. Patient not taking: Reported on 06/16/2016 09/17/12   Hendricks Limes, MD    Physical Exam: Vitals:   06/16/16 0141 06/16/16 0410 06/16/16 0542 06/16/16 0633  BP: 124/73 118/78 124/82 130/76  Pulse: 87 86 85 80  Resp: 18 17 18 17   Temp:  98.4 F (36.9 C) 98.4 F (36.9 C) 98.6 F (37 C)  TempSrc:  Oral Oral Oral  SpO2: 98% 99% 98% 99%  Weight:    87.8 kg (193 lb 9 oz)  Height:    5' 10.5" (1.791 m)      Constitutional: NAD, calm, comfortable Eyes: PERTLA, lids and conjunctivae normal ENMT: Mucous membranes are moist. Posterior pharynx clear of any exudate or lesions. Normal dentition.  Neck: normal, supple, no masses, no thyromegaly Respiratory: clear to auscultation bilaterally, no wheezing, no crackles. Normal respiratory effort. No accessory muscle use.  Cardiovascular: S1 & S2 heard, regular rate and rhythm, no murmurs / rubs / gallops. No extremity edema. 2+ pedal pulses. No carotid bruits.  Abdomen: No distension, no tenderness, no masses palpated. No hepatosplenomegaly. Bowel sounds normal.  Musculoskeletal: no clubbing / cyanosis. No joint deformity upper and lower extremities. Good ROM, no contractures. Normal muscle tone.  Skin: no rashes, lesions, ulcers. No induration Neurologic: CN 2-12 grossly intact. Sensation intact, DTR normal. Strength 5/5 in all 4 limbs.  Psychiatric: Normal judgment and insight. Alert and oriented x 3. Normal mood.     Labs on Admission: I have personally reviewed following labs and imaging studies  CBC:  Recent  Labs Lab 06/15/16 2229  WBC 19.1*  NEUTROABS 15.6*  HGB 15.9  HCT 46.3  MCV 94.9  PLT [id number]   Basic Metabolic Panel:  Recent Labs Lab 06/15/16 2229  NA 142  K 5.0  CL 108  CO2 26  GLUCOSE 103*  BUN 22*  CREATININE 1.49*  CALCIUM 9.6   GFR: Estimated Creatinine Clearance: 44.3 mL/min (by C-G formula based on SCr of 1.49 mg/dL (H)). Liver Function Tests:  Recent Labs Lab 06/15/16 2229  AST 20  ALT 22  ALKPHOS 72  BILITOT 0.6  PROT 7.5  ALBUMIN 4.1   No results for input(s): LIPASE, AMYLASE in the last 168 hours. No results for input(s): AMMONIA in the last 168 hours. Coagulation Profile: No results for input(s): INR, PROTIME in the last 168 hours. Cardiac Enzymes: No results  for input(s): CKTOTAL, CKMB, CKMBINDEX, TROPONINI in the last 168 hours. BNP (last 3 results) No results for input(s): PROBNP in the last 8760 hours. HbA1C: No results for input(s): HGBA1C in the last 72 hours. CBG: No results for input(s): GLUCAP in the last 168 hours. Lipid Profile: No results for input(s): CHOL, HDL, LDLCALC, TRIG, CHOLHDL, LDLDIRECT in the last 72 hours. Thyroid Function Tests: No results for input(s): TSH, T4TOTAL, FREET4, T3FREE, THYROIDAB in the last 72 hours. Anemia Panel: No results for input(s): VITAMINB12, FOLATE, FERRITIN, TIBC, IRON, RETICCTPCT in the last 72 hours. Urine analysis:    Component Value Date/Time   COLORURINE YELLOW 06/15/2016 2050   [place] 06/15/2016 2050   LABSPEC 1.020 06/15/2016 2050   PHURINE 7.0 06/15/2016 2050   GLUCOSEU NEGATIVE 06/15/2016 2050   HGBUR SMALL (A) 06/15/2016 2050   [place] NEGATIVE 06/15/2016 2050   [place] 06/15/2016 2050   PROTEINUR NEGATIVE 06/15/2016 2050   UROBILINOGEN 0.2 09/07/2010 0814   NITRITE NEGATIVE 06/15/2016 2050   LEUKOCYTESUR SMALL (A) 06/15/2016 2050   Sepsis Labs: @LABRCNTIP (procalcitonin:4,lacticidven:4) )No results found for this or any previous visit (from the  past 240 hour(s)).   Radiological Exams on Admission: US Scrotum  Result Date: 06/16/2016 CLINICAL DATA:  Initial evaluation for acute right testicular pain with bilateral swelling, fever. EXAM: SCROTAL ULTRASOUND DOPPLER ULTRASOUND OF THE TESTICLES TECHNIQUE: Complete ultrasound examination of the testicles, epididymis, and other scrotal structures was performed. Color and spectral Doppler ultrasound were also utilized to evaluate blood flow to the testicles. COMPARISON:  None. FINDINGS: Right testicle Measurements: 2.9 x 2.0 x 2.0 cm. No mass or microlithiasis visualized. Left testicle Measurements: 3.0 x 2.0 x 2.5 cm. Contour of the left testis somewhat irregular. Izora Gala with prominent than on the radiologist Antony Haste he upon the thin testicular ultrasound week Small cyst measuring 2.8 x 2.7 x 3.2 mm noted, of doubtful clinical significance. Right epididymis: Enlarged and heterogeneous. Increased vascularity. Few scattered small cysts noted. Left epididymis: Enlarged and heterogeneous. Increased vascularity. Few scattered small cysts noted. Hydrocele: Bilateral hydroceles, more prominent on the left. Hydroceles are somewhat complex with lacy internal architecture. Varicocele:  None visualized. Pulsed Doppler interrogation of both testes demonstrates normal low resistance arterial and venous waveforms bilaterally. IMPRESSION: 1. Enlarged and heterogeneous appearance of the epididymi bilaterally with increased vascularity, suggestive of acute epididymitis. 2. Bilateral complex hydroceles, likely reactive in nature. 3. No sonographic evidence for testicular torsion. Testes demonstrate relatively normal echotexture at this time, without evidence for associated acute orchitis. Electronically Signed   By: Jeannine Boga M.D.   On: 06/16/2016 02:12   [place] Art/ven Flow Abd Pelv Doppler  Result Date: 06/16/2016 CLINICAL DATA:  Initial evaluation for acute right testicular pain with bilateral swelling, fever.  EXAM: SCROTAL ULTRASOUND DOPPLER ULTRASOUND OF THE TESTICLES TECHNIQUE: Complete ultrasound examination of the testicles, epididymis, and other scrotal structures was performed. Color and spectral Doppler ultrasound were also utilized to evaluate blood flow to the testicles. COMPARISON:  None. FINDINGS: Right testicle Measurements: 2.9 x 2.0 x 2.0 cm. No mass or microlithiasis visualized. Left testicle Measurements: 3.0 x 2.0 x 2.5 cm. Contour of the left testis somewhat irregular. Izora Gala with prominent than on the radiologist Antony Haste he upon the thin testicular ultrasound week Small cyst measuring 2.8 x 2.7 x 3.2 mm noted, of doubtful clinical significance. Right epididymis: Enlarged and heterogeneous. Increased vascularity. Few scattered small cysts noted. Left epididymis: Enlarged and heterogeneous. Increased vascularity. Few scattered small cysts noted. Hydrocele: Bilateral hydroceles, more  prominent on the left. Hydroceles are somewhat complex with lacy internal architecture. Varicocele:  None visualized. Pulsed Doppler interrogation of both testes demonstrates normal low resistance arterial and venous waveforms bilaterally. IMPRESSION: 1. Enlarged and heterogeneous appearance of the epididymi bilaterally with increased vascularity, suggestive of acute epididymitis. 2. Bilateral complex hydroceles, likely reactive in nature. 3. No sonographic evidence for testicular torsion. Testes demonstrate relatively normal echotexture at this time, without evidence for associated acute orchitis. Electronically Signed   By: Jeannine Boga M.D.   On: 06/16/2016 02:12   Ct Renal Stone Study  Result Date: 06/16/2016 CLINICAL DATA:  Initial evaluation for acute fever with flank pain and testicular pain. EXAM: CT ABDOMEN AND PELVIS WITHOUT CONTRAST TECHNIQUE: Multidetector CT imaging of the abdomen and pelvis was performed following the standard protocol without IV contrast. COMPARISON:  Prior ultrasound from earlier the  same day. FINDINGS: Lower chest: Mild scattered pleural thickening at the bilateral lung bases with associated atelectasis, left greater than right. Associated calcified granuloma noted at the left lung base. Visualized lungs are otherwise clear. Hepatobiliary: Liver demonstrates a normal unenhanced appearance. Scattered stones present within the gallbladder lumen. No evidence for acute cholecystitis. No biliary dilatation. Pancreas: Pancreas within normal limits. Spleen: Spleen within normal limits. Adrenals/Urinary Tract: Adrenal glands are normal. Kidneys fairly equal in size without evidence of hydronephrosis. Scattered calculi present within the kidneys bilaterally, largest of which on the right is positioned within the interpolar region and measures 6 mm. Largest on the left present within the lower pole and measures 3 mm. Superimposed 17 mm cyst noted within the interpolar left kidney. No radiopaque calculi are seen along the course of either ureter. There is no hydroureter. No layering stones within the bladder lumen. Mild hazy stranding about the seminal vesicles bilaterally, suspected to be related to the acute inflammatory changes seen within both epididymis on prior ultrasound. Stomach/Bowel: Small hiatal hernia noted. Stomach otherwise unremarkable. No evidence for bowel obstruction. Appendix normal. No acute inflammatory changes seen about the bowels. Vascular/Lymphatic: Intra-abdominal aorta is tortuous with mild aorto bi-iliac atherosclerotic disease. No aneurysm. No adenopathy. Reproductive: Brachia therapy seeds in place for presumed prostate cancer. Other: No free intraperitoneal air. No free fluid. Small bilateral fat containing inguinal hernias noted, right slightly larger than left. Musculoskeletal: No acute osseous abnormality. No worrisome lytic or blastic osseous lesions. IMPRESSION: 1. Bilateral nonobstructive nephrolithiasis as above. No CT evidence for obstructive uropathy. 2. Mild hazy  inflammatory stranding about the seminal vesicles, suspected to be related to acute epididymitis as seen on prior ultrasound performed earlier the same day. 3. Small bilateral fat containing inguinal hernias, right larger than left. 4. Cholelithiasis. 5. Small hiatal hernia. Electronically Signed   By: Jeannine Boga M.D.   On: 06/16/2016 03:01    Assessment/Plan Principal Problem:   Epididymitis- with fever and leukocytosis - small amount of blood on pad was likely urethral - have spoken with Dr Karsten Ro- culture showing Pseudomonas sensitive to Amikacin, Aztreonam, Zosyn, Ceftaz, Imipenem - recommended 10 days of antibiotics and f/u with Dr Tresa Moore- testicular swelling may not improve for at least 1 months- plan explained to patient and wife - will narrow Zosyn to Azactam which will be TID- pharmacy to dose - will order PICC and Home health  Active Problems:  Small b/l fat containing inguinal hernias - R> L    Gout  - Probenecid    CKD (chronic kidney disease) stage 3, GFR 30-59 ml/min - Cr has been as high at 2-3 in  the past    PROSTATE CANCER, HX OF - treated in 2000  Recent bladdar stone? - none noted currently  Small hiatal hernia/ GERD - takes Nexium- will start Protonix   Cholelithiasis  - noted on CT  DVT prophylaxis: Lovenox  Code Status: Full code  Family Communication: wife  Disposition Plan: admit to med/surg  Consults called: phone consult with Dr Karsten Ro  Admission status: admit    [facility] MD [facility] Pager: [email or web address] Password [email or web address] 7PM-7AM, please contact night-coverage   06/16/2016, 9:51 AM

## 2016-06-16 NOTE — Progress Notes (Signed)
Discharge planning, spoke with patient at beside. Plan is for d/c home with IV abx for several weeks. Chose [facility] for [facility] services, contacted [facility] for referral. Patient states he would like to be able to administer meds, does not think wife would be a good teachable caregiver. [phone]

## 2016-06-16 NOTE — ED Notes (Signed)
US in room 

## 2016-06-16 NOTE — Progress Notes (Signed)
Pharmacy Antibiotic Note  Chris Burton is a 75 y.o. male admitted on 06/15/2016 with  Epididymitis- with fever and leukocytosis.  Pharmacy has been consulted for azactam dosing.  Culture from urology office showing Pseudomonas sensitive to Amikacin.  Planning on PICC placement and 10 days of antibiotics with home health.   Plan: -azactam 2gm IV q8h for CrCl > 56mls/hr -Recommend checking Scr at least twice a week and adjust dose accordingly   Height: 5' 10.5" (179.1 cm) Weight: 193 lb 9 oz (87.8 kg) IBW/kg (Calculated) : 74.15  Temp (24hrs), Avg:99 F (37.2 C), Min:98.4 F (36.9 C), Max:100.5 F (38.1 C)   Recent Labs Lab 06/15/16 2229 06/16/16 0151  WBC 19.1*  --   CREATININE 1.49*  --   LATICACIDVEN  --  1.20    Estimated Creatinine Clearance: 44.3 mL/min (by C-G formula based on SCr of 1.49 mg/dL (H)).    Allergies  Allergen Reactions  . Fish Oil     In combination with Niacin .REACTION: problems with esophagus; exacerbation of GERD & headache OD retro-orbitally   . Methylparaben Rash    Positive on skin test    Antimicrobials this admission: 10/19 rocephin x1 ED 10/19 zosyn x1  10/19 azactam >>   Microbiology results: 10/19 BCx: sent 10/19 UCx: sent  Thank you for allowing pharmacy to be a part of this patient's care.  Dolly Rias RPh 06/16/2016, 10:18 AM Pager [phone]

## 2016-06-16 NOTE — ED Provider Notes (Signed)
[place] DEPT Provider Note   CSN: [id number] Arrival date & time: 06/15/16  1952  By signing my name below, I, Chris Burton, attest that this documentation has been prepared under the direction and in the presence of physician practitioner, Orpah Greek, MD. Electronically Signed: Dora Burton, Scribe. 06/16/2016. 12:20 AM.  History   Chief Complaint Chief Complaint  Patient presents with  . Groin Swelling  . Fever    The history is provided by the patient. No language interpreter was used.     HPI Comments: Chris Burton is a 75 y.o. male with PMHx significant for CKD who presents to the Emergency Department complaining of sudden onset, constant, fever beginning around 3 PM yesterday afternoon. Pt states he has measured his temperature as high as 101. He has been dealing with multiple urinary problems for the last 3 weeks and was advised to see a doctor immediately if he came down with a fever. Pt reports he started experiencing urinary retention and dysuria 3 weeks ago. He was prescribed Cipro with no relief of these symptoms. He states he was seen at [facility] two weeks ago; he had blood in his urine and an ultrasound revealed a stone in his bladder. Pt had a catheter placed at this time; he states he wore it for 9 days and had it removed a few days ago. He was also switched from Cipro to Bactrim at this time and started experiencing intermittent fevers and left testicular pain/swelling as well. He was switched from Bactrim to Cefpodoxime a few days ago and states that it improved his symptoms significantly until he began having right testicular pain and a fever yesterday afternoon. He notes his left testicle is currently swollen but not painful. Pt applied ice to his right testicle yesterday with some relief of his pain. He also notes some moderate hematuria currently. Pt denies penile pain, penile swelling, abdominal pain, flank pain, back pain, or  any other associated symptoms.  Past Medical History:  Diagnosis Date  . Arthritis   . Cancer [facility]: Lambert Rhonda W)    prostate  . Chronic kidney disease    Dr Felicity Pellegrini, [facility]  . GERD (gastroesophageal reflux disease)   . Gout   . Hepatitis A    PMH  . Hx of adenomatous colonic polyps    Dr Olevia Perches  . Hyperlipidemia    LDL goal = < 100  . Skin cancer    [facility], Dr Sherrye Payor  . Sleep apnea     Patient Active Problem List   Diagnosis Date Noted  . COLONIC POLYPS 06/16/2010  . VITAMIN D DEFICIENCY 08/03/2009  . RENAL INSUFFICIENCY 08/19/2008  . ELEVATED BLOOD PRESSURE WITHOUT DIAGNOSIS OF HYPERTENSION 07/25/2008  . SKIN CANCER, HX OF 07/25/2008  . HYPERLIPIDEMIA 06/12/2007  . GOUT 06/12/2007  . PROSTATE CANCER, HX OF 06/12/2007  . NEPHROLITHIASIS, HX OF 06/12/2007    Past Surgical History:  Procedure Laterality Date  . COLONOSCOPY W/ POLYPECTOMY  2004   Dr Olevia Perches  . CYSTOSCOPY  2010   to remove kidney stone  . ESOPHAGEAL DILATION  2007   Dr Olevia Perches  . EXTERNAL BEAM RADIATION     & SEED IMPLANTS FOR RPOSTATE CANCER  . TONSILLECTOMY         Home Medications    Prior to Admission medications   Medication Sig Start Date End Date Taking? Authorizing Provider  B Complex-C (SUPER B COMPLEX PO) Take by mouth daily.   Yes Historical Provider, MD  cefpodoxime (VANTIN) 200 MG tablet Take 200 mg by mouth daily. 06/09/16  Yes Historical Provider, MD  esomeprazole (NEXIUM) 40 MG capsule Take 40 mg by mouth daily before breakfast.   Yes Historical Provider, MD  ibuprofen (ADVIL,MOTRIN) 200 MG tablet Take 400 mg by mouth every 6 (six) hours as needed for moderate pain.   Yes Historical Provider, MD  probenecid (BENEMID) 500 MG tablet TAKE 1 TABLET TWICE A DAY 07/16/13  Yes Hendricks Limes, MD  Psyllium (FIBER) 0.52 G CAPS Take by mouth daily.     Yes Historical Provider, MD  acyclovir ointment (ZOVIRAX) 5 % Apply topically every 3 (three) hours. Patient not taking: Reported on  06/16/2016 09/17/12   Hendricks Limes, MD    Family History Family History  Problem Relation Age of Onset  . Lung cancer Mother     SMOKER  . Prostate cancer Father   . Stomach cancer Sister   . Leukemia Paternal Grandmother   . Prostate cancer Paternal Grandfather   . Diabetes Maternal Grandmother   . Heart disease Neg Hx   . Kidney disease Neg Hx   . Stroke Neg Hx   . Colon cancer Neg Hx     Social History Social History  Substance Use Topics  . Smoking status: Former Smoker    Quit date: 08/29/1960  . Smokeless tobacco: Never Used  . Alcohol use No     Allergies   Fish oil and Methylparaben   Review of Systems Review of Systems  Constitutional: Positive for fever.  Gastrointestinal: Negative for abdominal pain.  Genitourinary: Positive for difficulty urinating, dysuria (resolved), hematuria, scrotal swelling (left testicle) and testicular pain (right testicle). Negative for flank pain, penile pain and penile swelling.  Musculoskeletal: Negative for back pain.  All other systems reviewed and are negative.   Physical Exam Updated Vital Signs BP 118/78 (BP Location: Right Arm)   Pulse 86   Temp 98.4 F (36.9 C) (Oral)   Resp 17   Ht 5\' 10"  (1.778 m)   Wt 189 lb (85.7 kg)   SpO2 99%   BMI 27.12 kg/m   Physical Exam  Constitutional: He is oriented to person, place, and time. He appears well-developed and well-nourished. No distress.  HENT:  Head: Normocephalic and atraumatic.  Right Ear: Hearing normal.  Left Ear: Hearing normal.  Nose: Nose normal.  Mouth/Throat: Oropharynx is clear and moist and mucous membranes are normal.  Eyes: Conjunctivae and EOM are normal. Pupils are equal, round, and reactive to light.  Neck: Normal range of motion. Neck supple.  Cardiovascular: Regular rhythm, S1 normal and S2 normal.  Exam reveals no gallop and no friction rub.   No murmur heard. Pulmonary/Chest: Effort normal and breath sounds normal. No respiratory  distress. He exhibits no tenderness.  Abdominal: Soft. Normal appearance and bowel sounds are normal. There is no hepatosplenomegaly. There is no tenderness. There is no rebound, no guarding, no tenderness at McBurney's point and negative Murphy's sign. No hernia.  Genitourinary:  Genitourinary Comments: Left scrotum: erythematous, indurated and tender Right scrotum: significant tenderness of the upper pole of the testicle  Musculoskeletal: Normal range of motion.  Neurological: He is alert and oriented to person, place, and time. He has normal strength. No cranial nerve deficit or sensory deficit. Coordination normal. GCS eye subscore is 4. GCS verbal subscore is 5. GCS motor subscore is 6.  Skin: Skin is warm, dry and intact. No rash noted. No cyanosis.  Psychiatric: He has a  normal mood and affect. His speech is normal and behavior is normal. Thought content normal.  Nursing note and vitals reviewed.   ED Treatments / Results  Labs (all labs ordered are listed, but only abnormal results are displayed) Labs Reviewed  COMPREHENSIVE METABOLIC PANEL - Abnormal; Notable for the following:       Result Value   Glucose, Bld 103 (*)    BUN 22 (*)    Creatinine, Ser 1.49 (*)    GFR calc non Af Amer 44 (*)    GFR calc Af Amer 51 (*)    All other components within normal limits  URINALYSIS, ROUTINE W REFLEX MICROSCOPIC (NOT AT [facility]) - Abnormal; Notable for the following:    Hgb urine dipstick SMALL (*)    Leukocytes, UA SMALL (*)    All other components within normal limits  URINE MICROSCOPIC-ADD ON - Abnormal; Notable for the following:    Squamous Epithelial / LPF 0-5 (*)    Bacteria, UA FEW (*)    All other components within normal limits  CBC WITH DIFFERENTIAL/PLATELET - Abnormal; Notable for the following:    WBC 19.1 (*)    Neutro Abs 15.6 (*)    Monocytes Absolute 1.6 (*)    All other components within normal limits  CULTURE, BLOOD (ROUTINE X 2)  CULTURE, BLOOD (ROUTINE X 2)    URINE CULTURE  I-STAT CG4 LACTIC ACID, ED    EKG  EKG Interpretation None       Radiology US Scrotum  Result Date: 06/16/2016 CLINICAL DATA:  Initial evaluation for acute right testicular pain with bilateral swelling, fever. EXAM: SCROTAL ULTRASOUND DOPPLER ULTRASOUND OF THE TESTICLES TECHNIQUE: Complete ultrasound examination of the testicles, epididymis, and other scrotal structures was performed. Color and spectral Doppler ultrasound were also utilized to evaluate blood flow to the testicles. COMPARISON:  None. FINDINGS: Right testicle Measurements: 2.9 x 2.0 x 2.0 cm. No mass or microlithiasis visualized. Left testicle Measurements: 3.0 x 2.0 x 2.5 cm. Contour of the left testis somewhat irregular. Izora Gala with prominent than on the radiologist Antony Haste he upon the thin testicular ultrasound week Small cyst measuring 2.8 x 2.7 x 3.2 mm noted, of doubtful clinical significance. Right epididymis: Enlarged and heterogeneous. Increased vascularity. Few scattered small cysts noted. Left epididymis: Enlarged and heterogeneous. Increased vascularity. Few scattered small cysts noted. Hydrocele: Bilateral hydroceles, more prominent on the left. Hydroceles are somewhat complex with lacy internal architecture. Varicocele:  None visualized. Pulsed Doppler interrogation of both testes demonstrates normal low resistance arterial and venous waveforms bilaterally. IMPRESSION: 1. Enlarged and heterogeneous appearance of the epididymi bilaterally with increased vascularity, suggestive of acute epididymitis. 2. Bilateral complex hydroceles, likely reactive in nature. 3. No sonographic evidence for testicular torsion. Testes demonstrate relatively normal echotexture at this time, without evidence for associated acute orchitis. Electronically Signed   By: Jeannine Boga M.D.   On: 06/16/2016 02:12   [place] Art/ven Flow Abd Pelv Doppler  Result Date: 06/16/2016 CLINICAL DATA:  Initial evaluation for acute right  testicular pain with bilateral swelling, fever. EXAM: SCROTAL ULTRASOUND DOPPLER ULTRASOUND OF THE TESTICLES TECHNIQUE: Complete ultrasound examination of the testicles, epididymis, and other scrotal structures was performed. Color and spectral Doppler ultrasound were also utilized to evaluate blood flow to the testicles. COMPARISON:  None. FINDINGS: Right testicle Measurements: 2.9 x 2.0 x 2.0 cm. No mass or microlithiasis visualized. Left testicle Measurements: 3.0 x 2.0 x 2.5 cm. Contour of the left testis somewhat irregular. Izora Gala with prominent than on  the radiologist Antony Haste he upon the thin testicular ultrasound week Small cyst measuring 2.8 x 2.7 x 3.2 mm noted, of doubtful clinical significance. Right epididymis: Enlarged and heterogeneous. Increased vascularity. Few scattered small cysts noted. Left epididymis: Enlarged and heterogeneous. Increased vascularity. Few scattered small cysts noted. Hydrocele: Bilateral hydroceles, more prominent on the left. Hydroceles are somewhat complex with lacy internal architecture. Varicocele:  None visualized. Pulsed Doppler interrogation of both testes demonstrates normal low resistance arterial and venous waveforms bilaterally. IMPRESSION: 1. Enlarged and heterogeneous appearance of the epididymi bilaterally with increased vascularity, suggestive of acute epididymitis. 2. Bilateral complex hydroceles, likely reactive in nature. 3. No sonographic evidence for testicular torsion. Testes demonstrate relatively normal echotexture at this time, without evidence for associated acute orchitis. Electronically Signed   By: Jeannine Boga M.D.   On: 06/16/2016 02:12   Ct Renal Stone Study  Result Date: 06/16/2016 CLINICAL DATA:  Initial evaluation for acute fever with flank pain and testicular pain. EXAM: CT ABDOMEN AND PELVIS WITHOUT CONTRAST TECHNIQUE: Multidetector CT imaging of the abdomen and pelvis was performed following the standard protocol without IV contrast.  COMPARISON:  Prior ultrasound from earlier the same day. FINDINGS: Lower chest: Mild scattered pleural thickening at the bilateral lung bases with associated atelectasis, left greater than right. Associated calcified granuloma noted at the left lung base. Visualized lungs are otherwise clear. Hepatobiliary: Liver demonstrates a normal unenhanced appearance. Scattered stones present within the gallbladder lumen. No evidence for acute cholecystitis. No biliary dilatation. Pancreas: Pancreas within normal limits. Spleen: Spleen within normal limits. Adrenals/Urinary Tract: Adrenal glands are normal. Kidneys fairly equal in size without evidence of hydronephrosis. Scattered calculi present within the kidneys bilaterally, largest of which on the right is positioned within the interpolar region and measures 6 mm. Largest on the left present within the lower pole and measures 3 mm. Superimposed 17 mm cyst noted within the interpolar left kidney. No radiopaque calculi are seen along the course of either ureter. There is no hydroureter. No layering stones within the bladder lumen. Mild hazy stranding about the seminal vesicles bilaterally, suspected to be related to the acute inflammatory changes seen within both epididymis on prior ultrasound. Stomach/Bowel: Small hiatal hernia noted. Stomach otherwise unremarkable. No evidence for bowel obstruction. Appendix normal. No acute inflammatory changes seen about the bowels. Vascular/Lymphatic: Intra-abdominal aorta is tortuous with mild aorto bi-iliac atherosclerotic disease. No aneurysm. No adenopathy. Reproductive: Brachia therapy seeds in place for presumed prostate cancer. Other: No free intraperitoneal air. No free fluid. Small bilateral fat containing inguinal hernias noted, right slightly larger than left. Musculoskeletal: No acute osseous abnormality. No worrisome lytic or blastic osseous lesions. IMPRESSION: 1. Bilateral nonobstructive nephrolithiasis as above. No CT  evidence for obstructive uropathy. 2. Mild hazy inflammatory stranding about the seminal vesicles, suspected to be related to acute epididymitis as seen on prior ultrasound performed earlier the same day. 3. Small bilateral fat containing inguinal hernias, right larger than left. 4. Cholelithiasis. 5. Small hiatal hernia. Electronically Signed   By: Jeannine Boga M.D.   On: 06/16/2016 03:01    Procedures Procedures (including critical care time)  DIAGNOSTIC STUDIES: Oxygen Saturation is 95% on RA, adequate by my interpretation.    COORDINATION OF CARE: 12:40 AM Discussed treatment plan with pt at bedside and pt agreed to plan.  Medications Ordered in ED Medications  cefTRIAXone (ROCEPHIN) 1 g in dextrose 5 % 50 mL IVPB (0 g Intravenous Stopped 06/16/16 0321)  sodium chloride 0.9 % bolus 500 mL (0  mLs Intravenous Stopped 06/16/16 0322)     Initial Impression / Assessment and Plan / ED Course  I have reviewed the triage vital signs and the nursing notes.  Pertinent labs & imaging results that were available during my care of the patient were reviewed by me and considered in my medical decision making (see chart for details).  Clinical Course    Patient presents to the emergency department with complaints of testicular pain and fever. Patient was recently told that he had a kidney stone as well as urinary tract infection. Symptoms started approximately 3 weeks ago. He was initially on Cipro and did not have improvement. He was changed to Bactrim. 3 days ago he was changed to [employer]. Patient has been back and forth to the urologist. He did have a catheter placed for urinary retention for approximately a week, remove several days ago. Since that time he has had pain and swelling of both of his testicles. Today he developed a fever, was told to come to the ER if he developed any fevers.  Patient does not appear to be suffering from severe sepsis. He is normotensive. Temperature 100.5.  In the ER. Lactic acid 1.2. Patient does have a significant leukocytosis, white blood cell count is 19.1. Examination revealed severe induration, thickening, erythema of the scrotum, left greater than right. He has bilateral epididymal tenderness. Urinalysis shows signs of persistent infection. CT renal study did not show evidence of ureterolithiasis. Ultrasound of testicles and scrotum reveals bilateral epididymitis. Patient has been on 3 different antibiotics without improvement. Symptoms have been ongoing for 3 weeks. He is still experiencing fevers. This is considered a failure of outpatient therapy for urinary tract infection and epididymitis. Examination also concerning for cellulitis of the scrotum. Patient will require hospitalization for IV antibiotic therapy.  I personally performed the services described in this documentation, which was scribed in my presence. The recorded information has been reviewed and is accurate.   Final Clinical Impressions(s) / ED Diagnoses   Final diagnoses:  Urinary tract infection with hematuria, site unspecified  Epididymitis  Cellulitis of scrotum    New Prescriptions New Prescriptions   No medications on file     Orpah Greek, MD 06/16/16 0425

## 2016-06-17 ENCOUNTER — Inpatient Hospital Stay (HOSPITAL_COMMUNITY): Payer: Medicare Other

## 2016-06-17 ENCOUNTER — Encounter (HOSPITAL_COMMUNITY): Payer: Self-pay | Admitting: Interventional Radiology

## 2016-06-17 DIAGNOSIS — N451 Epididymitis: Principal | ICD-10-CM

## 2016-06-17 HISTORY — PX: IR GENERIC HISTORICAL: IMG1180011

## 2016-06-17 LAB — BASIC METABOLIC PANEL
Anion gap: 8 (ref 5–15)
BUN: 17 mg/dL (ref 6–20)
CHLORIDE: 105 mmol/L (ref 101–111)
CO2: 25 mmol/L (ref 22–32)
CREATININE: 1.45 mg/dL — AB (ref 0.61–1.24)
Calcium: 8.9 mg/dL (ref 8.9–10.3)
GFR calc Af Amer: 52 mL/min — ABNORMAL LOW (ref >60)
GFR calc non Af Amer: 45 mL/min — ABNORMAL LOW (ref >60)
Glucose, Bld: 113 mg/dL — ABNORMAL HIGH (ref 65–99)
POTASSIUM: 4.7 mmol/L (ref 3.5–5.1)
Sodium: 138 mmol/L (ref 135–145)

## 2016-06-17 LAB — PROTIME-INR
INR: 1.16
Prothrombin Time: 14.8 seconds (ref 11.4–15.2)

## 2016-06-17 LAB — CBC
HEMATOCRIT: 40.7 % (ref 39.0–52.0)
Hemoglobin: 14.2 g/dL (ref 13.0–17.0)
MCH: 31.8 pg (ref 26.0–34.0)
MCHC: 34.9 g/dL (ref 30.0–36.0)
MCV: 91.3 fL (ref 78.0–100.0)
PLATELETS: 174 10*3/uL (ref 150–400)
RBC: 4.46 MIL/uL (ref 4.22–5.81)
RDW: 12.4 % (ref 11.5–15.5)
WBC: 13.2 10*3/uL — ABNORMAL HIGH (ref 4.0–10.5)

## 2016-06-17 MED ORDER — VALACYCLOVIR HCL 500 MG PO TABS: 500.0000 mg | ORAL_TABLET | Freq: Two times a day (BID) | ORAL | 0 refills | Status: AC

## 2016-06-17 MED ORDER — HYDROCODONE-ACETAMINOPHEN 5-325 MG PO TABS: 1.0000 | ORAL_TABLET | ORAL | 0 refills | Status: DC | PRN

## 2016-06-17 MED ORDER — LIDOCAINE HCL 1 % IJ SOLN
INTRAMUSCULAR | Status: AC
  Filled 2016-06-17: qty 20

## 2016-06-17 MED ORDER — HEPARIN SOD (PORK) LOCK FLUSH 100 UNIT/ML IV SOLN
250.0000 [IU] | INTRAVENOUS | Status: DC | PRN
  Administered 2016-06-17: 250 [IU]

## 2016-06-17 MED ORDER — SODIUM CHLORIDE 0.9% FLUSH
10.0000 mL | INTRAVENOUS | Status: DC | PRN
  Administered 2016-06-17: 10 mL

## 2016-06-17 MED ORDER — LIDOCAINE HCL 1 % IJ SOLN
INTRAMUSCULAR | Status: DC | PRN
  Administered 2016-06-17: 10 mL

## 2016-06-17 MED ORDER — DEXTROSE 5 % IV SOLN: 2.0000 g | Freq: Three times a day (TID) | INTRAVENOUS | 0 refills | Status: DC

## 2016-06-17 NOTE — Discharge Instructions (Signed)
Epididymitis °Epididymitis is swelling (inflammation) of the epididymis. The epididymis is a cord-like structure that is located along the top and back part of the testicle. It collects and stores sperm from the testicle. °This condition can also cause pain and swelling of the testicle and scrotum. Symptoms usually start suddenly (acute epididymitis). Sometimes epididymitis starts gradually and lasts for a while (chronic epididymitis). This type may be harder to treat. °CAUSES °In men 35 and younger, this condition is usually caused by a bacterial infection or sexually transmitted disease (STD), such as: °· Gonorrhea. °· Chlamydia.   °In men 35 and older who do not have anal sex, this condition is usually caused by bacteria from a blockage or abnormalities in the urinary system. These can result from: °· Having a tube placed into the bladder (urinary catheter). °· Having an enlarged or inflamed prostate gland. °· Having recent urinary tract surgery. °In men who have a condition that weakens the body's defense system (immune system), such as HIV, this condition can be caused by:  °· Other bacteria, including tuberculosis and syphilis. °· Viruses. °· Fungi. °Sometimes this condition occurs without infection. That may happen if urine flows backward into the epididymis after heavy lifting or straining. °RISK FACTORS °This condition is more likely to develop in men: °· Who have unprotected sex with more than one partner. °· Who have anal sex.   °· Who have recently had surgery.   °· Who have a urinary catheter. °· Who have urinary problems. °· Who have a suppressed immune system.   °SYMPTOMS  °This condition usually begins suddenly with chills, fever, and pain behind the scrotum and in the testicle. Other symptoms include:  °· Swelling of the scrotum, testicle, or both. °· Pain when ejaculating or urinating. °· Pain in the back or belly. °· Nausea. °· Itching and discharge from the penis. °· Frequent need to pass  urine. °· Redness and tenderness of the scrotum. °DIAGNOSIS °Your health care provider can diagnose this condition based on your symptoms and medical history. Your health care provider will also do a physical exam to ask about your symptoms and check your scrotum and testicle for swelling, pain, and redness. You may also have other tests, including:   °· Examination of discharge from the penis. °· Urine tests for infections, such as STDs.   °Your health care provider may test you for other STDs, including HIV.  °TREATMENT °Treatment for this condition depends on the cause. If your condition is caused by a bacterial infection, oral antibiotic medicine may be prescribed. If the bacterial infection has spread to your blood, you may need to receive IV antibiotics. Nonbacterial epididymitis is treated with home care that includes bed rest and elevation of the scrotum. °Surgery may be needed to treat: °· Bacterial epididymitis that causes pus to build up in the scrotum (abscess). °· Chronic epididymitis that has not responded to other treatments. °HOME CARE INSTRUCTIONS °Medicines  °· Take over-the-counter and prescription medicines only as told by your health care provider.   °· If you were prescribed an antibiotic medicine, take it as told by your health care provider. Do not stop taking the antibiotic even if your condition improves. °Sexual Activity  °· If your epididymitis was caused by an STD, avoid sexual activity until your treatment is complete. °· Inform your sexual partner or partners if you test positive for an STD. They may need to be treated. Do not engage in sexual activity with your partner or partners until their treatment is completed. °General Instructions  °· Return to your normal activities as told   by your health care provider. Ask your health care provider what activities are safe for you. °· Keep your scrotum elevated and supported while resting. Ask your health care provider if you should wear a  scrotal support, such as a jockstrap. Wear it as told by your health care provider. °· If directed, apply ice to the affected area:   °¨ Put ice in a plastic bag. °¨ Place a towel between your skin and the bag. °¨ Leave the ice on for 20 minutes, 2-3 times per day. °· Try taking a sitz bath to help with discomfort. This is a warm water bath that is taken while you are sitting down. The water should only come up to your hips and should cover your buttocks. Do this 3-4 times per day or as told by your health care provider. °· Keep all follow-up visits as told by your health care provider. This is important. °SEEK MEDICAL CARE IF:  °· You have a fever.   °· Your pain medicine is not helping.   °· Your pain is getting worse.   °· Your symptoms do not improve within three days. °  °This information is not intended to replace advice given to you by your health care provider. Make sure you discuss any questions you have with your health care provider. °  °Document Released: 08/12/2000 Document Revised: 05/06/2015 Document Reviewed: 12/31/2014 °Elsevier Interactive Patient Education ©2016 [employer] ° °

## 2016-06-17 NOTE — Procedures (Signed)
Interventional Radiology Procedure Note  Procedure: Placement of a right IJ approach single lumen tunneled central line.  Tip is positioned at the superior cavoatrial junction and catheter is ready for immediate use.  Complications: None Recommendations:  - Ok to use - Do not submerge  - Routine line care   Signed,  Dulcy Fanny. Earleen Newport, DO

## 2016-06-17 NOTE — Discharge Summary (Signed)
[facility]  Physician Discharge Summary   Patient ID: Chris Burton MRN: [id number] DOB/AGE: 1940/10/25 75 y.o.  Admit date: 06/15/2016 Discharge date: 06/17/2016  PCP: ANDY,CAMILLE L, MD  DISCHARGE DIAGNOSES:  Principal Problem:   Epididymitis Active Problems:   Gout   PROSTATE CANCER, HX OF   NEPHROLITHIASIS, HX OF   CKD (chronic kidney disease) stage 3, GFR 30-59 ml/min   RECOMMENDATIONS FOR OUTPATIENT FOLLOW UP: 1. Follow-up with urology next week 2. Home health has been ordered for IV antibiotics   DISCHARGE CONDITION: fair  Diet recommendation: As before  [facility] Weights   06/15/16 2042 06/16/16 [id number]  Weight: 85.7 kg (189 lb) 87.8 kg (193 lb 9 oz)    INITIAL HISTORY: Chris Burton is a 75 y.o. male with medical history significant of prostate cancer, gout, hyperlipidemia, sleep apnea, chronic kidney disease stage III. Patient presented with fever, testicular pain and swelling. He has been seeing a urologist for urinary issues for the past 3 weeks and had been placed on ciprofloxacin, subsequently Bactrim and then cefpodoxime without any significant improvement. Patient presented to the ED. He underwent initially a CT scan of his renal system, which showed nonobstructive Nephrolithiasis. However, inflammatory stranding was noted around the seminal vesicles. He subsequently underwent scrotal ultrasound which shows acute epididymitis.   Consultations:  Phone discussion with urology  Procedures:  IR to place PICC line   HOSPITAL COURSE:   Acute Epididymitis- with fever and leukocytosis - small amount of blood on pad was likely urethral - Case was discussed with urologist, Dr Karsten Ro. Recent culture obtained at the neurology office showing Pseudomonas sensitive to Amikacin, Aztreonam, Zosyn, Ceftaz, Imipenem. Urologist recommended 10 days of intravenous antibiotics. Patient started on Aztreonam. Patient to follow-up with his urologist in 1 week. -  Patient feels better this morning. WBC is improved from 19000 to 13,000. Scrotal support will be ordered. PICC line has been ordered and to be placed by interventional radiology. Home health has ordered as well.   Small b/l fat containing inguinal hernias Stable. Outpatient monitoring.  History of Gout  Continue Probenecid  CKD (chronic kidney disease) stage 3, GFR 30-59 ml/min Cr has been as high at 2-3 in the past. Currently stable. Outpatient monitoring.  PROSTATE CANCER, HX OF Treated in 2000.   Recent bladdar stone? None noted currently  Small hiatal hernia/GERD Continuing home medications  Cholelithiasis  Incidentally noted on CT scan. Asymptomatic.  Overall, stable. Feels better. Okay for discharge home later today once PICC line is in place.   PERTINENT LABS:  The results of significant diagnostics from this hospitalization (including imaging, microbiology, ancillary and laboratory) are listed below for reference.    Microbiology: Recent Results (from the past 240 hour(s))  Urine culture     Status: Abnormal (Preliminary result)   Collection Time: 06/15/16  8:50 PM  Result Value Ref Range Status   Specimen Description URINE, RANDOM  Final   Special Requests NONE  Final   Culture 20,000 COLONIES/mL PSEUDOMONAS AERUGINOSA (A)  Final   Report Status PENDING  Incomplete  Culture, blood (Routine X 2)     Status: None (Preliminary result)   Collection Time: 06/15/16 10:17 PM  Result Value Ref Range Status   Specimen Description BLOOD LEFT ANTECUBITAL  Final   Special Requests BOTTLES DRAWN AEROBIC AND ANAEROBIC 5ML EA  Final   Culture   Final    NO GROWTH 1 DAY Performed at [facility]    Report Status PENDING  Incomplete  Culture, blood (Routine X 2)     Status: None (Preliminary result)   Collection Time: 06/15/16 10:29 PM  Result Value Ref Range Status   Specimen Description BLOOD RIGHT ANTECUBITAL  Final   Special Requests BOTTLES DRAWN  AEROBIC AND ANAEROBIC 5ML EA  Final   Culture   Final    NO GROWTH 1 DAY Performed at [facility]    Report Status PENDING  Incomplete     Labs: Basic Metabolic Panel:  Recent Labs Lab 06/15/16 2229 06/17/16 0521  NA 142 138  K 5.0 4.7  CL 108 105  CO2 26 25  GLUCOSE 103* 113*  BUN 22* 17  CREATININE 1.49* 1.45*  CALCIUM 9.6 8.9   Liver Function Tests:  Recent Labs Lab 06/15/16 2229  AST 20  ALT 22  ALKPHOS 72  BILITOT 0.6  PROT 7.5  ALBUMIN 4.1   CBC:  Recent Labs Lab 06/15/16 2229 06/17/16 0521  WBC 19.1* 13.2*  NEUTROABS 15.6*  --   HGB 15.9 14.2  HCT 46.3 40.7  MCV 94.9 91.3  PLT 222 174     IMAGING STUDIES US Scrotum  Result Date: June 29, 2016 CLINICAL DATA:  Initial evaluation for acute right testicular pain with bilateral swelling, fever. EXAM: SCROTAL ULTRASOUND DOPPLER ULTRASOUND OF THE TESTICLES TECHNIQUE: Complete ultrasound examination of the testicles, epididymis, and other scrotal structures was performed. Color and spectral Doppler ultrasound were also utilized to evaluate blood flow to the testicles. COMPARISON:  None. FINDINGS: Right testicle Measurements: 2.9 x 2.0 x 2.0 cm. No mass or microlithiasis visualized. Left testicle Measurements: 3.0 x 2.0 x 2.5 cm. Contour of the left testis somewhat irregular. Izora Gala with prominent than on the radiologist Antony Haste he upon the thin testicular ultrasound week Small cyst measuring 2.8 x 2.7 x 3.2 mm noted, of doubtful clinical significance. Right epididymis: Enlarged and heterogeneous. Increased vascularity. Few scattered small cysts noted. Left epididymis: Enlarged and heterogeneous. Increased vascularity. Few scattered small cysts noted. Hydrocele: Bilateral hydroceles, more prominent on the left. Hydroceles are somewhat complex with lacy internal architecture. Varicocele:  None visualized. Pulsed Doppler interrogation of both testes demonstrates normal low resistance arterial and venous waveforms  bilaterally. IMPRESSION: 1. Enlarged and heterogeneous appearance of the epididymi bilaterally with increased vascularity, suggestive of acute epididymitis. 2. Bilateral complex hydroceles, likely reactive in nature. 3. No sonographic evidence for testicular torsion. Testes demonstrate relatively normal echotexture at this time, without evidence for associated acute orchitis. Electronically Signed   By: Jeannine Boga M.D.   On: Jun 29, 2016 02:12   [place] Art/ven Flow Abd Pelv Doppler  Result Date: 2016/06/29 CLINICAL DATA:  Initial evaluation for acute right testicular pain with bilateral swelling, fever. EXAM: SCROTAL ULTRASOUND DOPPLER ULTRASOUND OF THE TESTICLES TECHNIQUE: Complete ultrasound examination of the testicles, epididymis, and other scrotal structures was performed. Color and spectral Doppler ultrasound were also utilized to evaluate blood flow to the testicles. COMPARISON:  None. FINDINGS: Right testicle Measurements: 2.9 x 2.0 x 2.0 cm. No mass or microlithiasis visualized. Left testicle Measurements: 3.0 x 2.0 x 2.5 cm. Contour of the left testis somewhat irregular. Izora Gala with prominent than on the radiologist Antony Haste he upon the thin testicular ultrasound week Small cyst measuring 2.8 x 2.7 x 3.2 mm noted, of doubtful clinical significance. Right epididymis: Enlarged and heterogeneous. Increased vascularity. Few scattered small cysts noted. Left epididymis: Enlarged and heterogeneous. Increased vascularity. Few scattered small cysts noted. Hydrocele: Bilateral hydroceles, more prominent on the left. Hydroceles are somewhat complex with lacy internal  architecture. Varicocele:  None visualized. Pulsed Doppler interrogation of both testes demonstrates normal low resistance arterial and venous waveforms bilaterally. IMPRESSION: 1. Enlarged and heterogeneous appearance of the epididymi bilaterally with increased vascularity, suggestive of acute epididymitis. 2. Bilateral complex hydroceles, likely  reactive in nature. 3. No sonographic evidence for testicular torsion. Testes demonstrate relatively normal echotexture at this time, without evidence for associated acute orchitis. Electronically Signed   By: Jeannine Boga M.D.   On: 06/16/2016 02:12   Ct Renal Stone Study  Result Date: 06/16/2016 CLINICAL DATA:  Initial evaluation for acute fever with flank pain and testicular pain. EXAM: CT ABDOMEN AND PELVIS WITHOUT CONTRAST TECHNIQUE: Multidetector CT imaging of the abdomen and pelvis was performed following the standard protocol without IV contrast. COMPARISON:  Prior ultrasound from earlier the same day. FINDINGS: Lower chest: Mild scattered pleural thickening at the bilateral lung bases with associated atelectasis, left greater than right. Associated calcified granuloma noted at the left lung base. Visualized lungs are otherwise clear. Hepatobiliary: Liver demonstrates a normal unenhanced appearance. Scattered stones present within the gallbladder lumen. No evidence for acute cholecystitis. No biliary dilatation. Pancreas: Pancreas within normal limits. Spleen: Spleen within normal limits. Adrenals/Urinary Tract: Adrenal glands are normal. Kidneys fairly equal in size without evidence of hydronephrosis. Scattered calculi present within the kidneys bilaterally, largest of which on the right is positioned within the interpolar region and measures 6 mm. Largest on the left present within the lower pole and measures 3 mm. Superimposed 17 mm cyst noted within the interpolar left kidney. No radiopaque calculi are seen along the course of either ureter. There is no hydroureter. No layering stones within the bladder lumen. Mild hazy stranding about the seminal vesicles bilaterally, suspected to be related to the acute inflammatory changes seen within both epididymis on prior ultrasound. Stomach/Bowel: Small hiatal hernia noted. Stomach otherwise unremarkable. No evidence for bowel obstruction. Appendix  normal. No acute inflammatory changes seen about the bowels. Vascular/Lymphatic: Intra-abdominal aorta is tortuous with mild aorto bi-iliac atherosclerotic disease. No aneurysm. No adenopathy. Reproductive: Brachia therapy seeds in place for presumed prostate cancer. Other: No free intraperitoneal air. No free fluid. Small bilateral fat containing inguinal hernias noted, right slightly larger than left. Musculoskeletal: No acute osseous abnormality. No worrisome lytic or blastic osseous lesions. IMPRESSION: 1. Bilateral nonobstructive nephrolithiasis as above. No CT evidence for obstructive uropathy. 2. Mild hazy inflammatory stranding about the seminal vesicles, suspected to be related to acute epididymitis as seen on prior ultrasound performed earlier the same day. 3. Small bilateral fat containing inguinal hernias, right larger than left. 4. Cholelithiasis. 5. Small hiatal hernia. Electronically Signed   By: Jeannine Boga M.D.   On: 06/16/2016 03:01    DISCHARGE EXAMINATION: Vitals:   06/16/16 1445 06/16/16 2121 06/17/16 0508 06/17/16 1339  BP: 127/77 118/72 115/77 125/75  Pulse: 81 80 77 77  Resp: 15 16 16 15   Temp: 98.8 F (37.1 C) 98.6 F (37 C) 98.5 F (36.9 C) 98.4 F (36.9 C)  TempSrc: Oral Oral Oral Oral  SpO2: 98% 95% 94% 96%  Weight:      Height:       General appearance: alert, cooperative, appears stated age and no distress Resp: clear to auscultation bilaterally Cardio: regular rate and rhythm, S1, S2 normal, no murmur, click, rub or gallop GI: soft, non-tender; bowel sounds normal; no masses,  no organomegaly Male genitalia: Enlarged scrotum noted. Bilaterally. Tender. Mildly erythematous. No fluctuation.  DISPOSITION: Home with spouse  Discharge Instructions  Call MD for:  difficulty breathing, headache or visual disturbances    Complete by:  As directed    Call MD for:  extreme fatigue    Complete by:  As directed    Call MD for:  persistant dizziness or  light-headedness    Complete by:  As directed    Call MD for:  persistant nausea and vomiting    Complete by:  As directed    Call MD for:  severe uncontrolled pain    Complete by:  As directed    Call MD for:  temperature >100.4    Complete by:  As directed    Discharge instructions    Complete by:  As directed    Please be sure to follow up with Dr. Tresa Moore before end of next week.  You were cared for by a hospitalist during your hospital stay. If you have any questions about your discharge medications or the care you received while you were in the hospital after you are discharged, you can call the unit and asked to speak with the hospitalist on call if the hospitalist that took care of you is not available. Once you are discharged, your primary care physician will handle any further medical issues. Please note that NO REFILLS for any discharge medications will be authorized once you are discharged, as it is imperative that you return to your primary care physician (or establish a relationship with a primary care physician if you do not have one) for your aftercare needs so that they can reassess your need for medications and monitor your lab values. If you do not have a primary care physician, you can call [phone] for a physician referral.   Increase activity slowly    Complete by:  As directed       ALLERGIES:  Allergies  Allergen Reactions  . Fish Oil     In combination with Niacin .REACTION: problems with esophagus; exacerbation of GERD & headache OD retro-orbitally   . Methylparaben Rash    Positive on skin test      Current Discharge Medication List    START taking these medications   Details  aztreonam 2 g in dextrose 5 % 50 mL Inject 2 g into the vein every 8 (eight) hours. For 10 days Qty: 30 each, Refills: 0    HYDROcodone-acetaminophen (NORCO/VICODIN) 5-325 MG tablet Take 1 tablet by mouth every 4 (four) hours as needed for moderate pain. Qty: 15 tablet, Refills: 0      valACYclovir (VALTREX) 500 MG tablet Take 1 tablet (500 mg total) by mouth 2 (two) times daily. Qty: 20 tablet, Refills: 0      CONTINUE these medications which have NOT CHANGED   Details  B Complex-C (SUPER B COMPLEX PO) Take by mouth daily.    esomeprazole (NEXIUM) 40 MG capsule Take 40 mg by mouth daily before breakfast.    probenecid (BENEMID) 500 MG tablet TAKE 1 TABLET TWICE A DAY Qty: 60 tablet, Refills: 0    Psyllium (FIBER) 0.52 G CAPS Take by mouth daily.        STOP taking these medications     cefpodoxime (VANTIN) 200 MG tablet      ibuprofen (ADVIL,MOTRIN) 200 MG tablet      acyclovir ointment (ZOVIRAX) 5 %          Follow-up Information    [facility] .   Why:  IV antibiotics Contact information: [address] [place] [address]  [phone]        Alexis Frock, MD. Schedule an appointment as soon as possible for a visit in 5 day(s).   Specialty:  Urology Contact information: [address] [place] [address] [phone]           TOTAL DISCHARGE TIME: 59 mins  [facility] Pager [phone]  06/17/2016, 5:31 PM

## 2016-06-20 LAB — URINE CULTURE: Culture: 20000 — AB

## 2016-06-21 LAB — CULTURE, BLOOD (ROUTINE X 2)
Culture: NO GROWTH
Culture: NO GROWTH

## 2016-06-28 ENCOUNTER — Other Ambulatory Visit: Payer: Self-pay | Admitting: Urology

## 2016-06-28 ENCOUNTER — Encounter (HOSPITAL_COMMUNITY): Payer: Self-pay | Admitting: Interventional Radiology

## 2016-06-28 DIAGNOSIS — N451 Epididymitis: Secondary | ICD-10-CM

## 2016-06-30 ENCOUNTER — Ambulatory Visit (HOSPITAL_COMMUNITY)
Admission: RE | Admit: 2016-06-30 | Discharge: 2016-06-30 | Disposition: A | Discharge status: Home / Self Care | Payer: Medicare Other | Source: Ambulatory Visit | Attending: Urology | Admitting: Urology

## 2016-06-30 ENCOUNTER — Encounter (HOSPITAL_COMMUNITY): Payer: Self-pay | Admitting: Interventional Radiology

## 2016-06-30 DIAGNOSIS — Z4689 Encounter for fitting and adjustment of other specified devices: Secondary | ICD-10-CM | POA: Insufficient documentation

## 2016-06-30 DIAGNOSIS — N200 Calculus of kidney: Secondary | ICD-10-CM | POA: Insufficient documentation

## 2016-06-30 DIAGNOSIS — N451 Epididymitis: Secondary | ICD-10-CM | POA: Insufficient documentation

## 2016-06-30 DIAGNOSIS — N390 Urinary tract infection, site not specified: Secondary | ICD-10-CM | POA: Diagnosis not present

## 2016-06-30 HISTORY — PX: IR GENERIC HISTORICAL: IMG1180011

## 2016-06-30 MED ORDER — LIDOCAINE HCL 1 % IJ SOLN
INTRAMUSCULAR | Status: AC
  Filled 2016-06-30: qty 20

## 2016-06-30 NOTE — Procedures (Signed)
S/p RT IJ TUNNELED PICC REMOVAL  NO COMP STABLE FULL REPORT IN PACS

## 2016-07-04 ENCOUNTER — Encounter: Payer: Self-pay | Admitting: Gastroenterology

## 2021-01-18 NOTE — Progress Notes (Signed)
Pt. Needs orders for upcoming surgery.PAT and labs on: 01/18/21.

## 2021-01-18 NOTE — Patient Instructions (Signed)
DUE TO COVID-19 ONLY ONE VISITOR IS ALLOWED TO COME WITH YOU AND STAY IN THE WAITING ROOM ONLY DURING PRE OP AND PROCEDURE DAY OF SURGERY. THE 1 VISITOR  MAY VISIT WITH YOU AFTER SURGERY IN YOUR PRIVATE ROOM DURING VISITING HOURS ONLY!                Chris Burton   Your procedure is scheduled on: 01/26/21   Report to [facility] Main  Entrance   Report to admitting at: 12:45 PM     Call this number if you have problems the morning of surgery [phone]    Remember: Do not eat solid food :After Midnight. Clear liquids until: 11:45 am.  CLEAR LIQUID DIET  Foods Allowed                                                                     Foods Excluded  Coffee and tea, regular and decaf                             liquids that you cannot  Plain Jell-O any favor except red or purple                                           see through such as: Fruit ices (not with fruit pulp)                                     milk, soups, orange juice  Iced Popsicles                                    All solid food Carbonated beverages, regular and diet                                    Cranberry, grape and apple juices Sports drinks like Gatorade Lightly seasoned clear broth or consume(fat free) Sugar, honey syrup  Sample Menu Breakfast                                Lunch                                     Supper Cranberry juice                    Beef broth                            Chicken broth Jell-O  Grape juice                           Apple juice Coffee or tea                        Jell-O                                      Popsicle                                                Coffee or tea                        Coffee or tea  _____________________________________________________________________  BRUSH YOUR TEETH MORNING OF SURGERY AND RINSE YOUR MOUTH OUT, NO CHEWING GUM CANDY OR MINTS.    Take these medicines the morning of  surgery with A SIP OF WATER: probenecid.              You may not have any metal on your body including hair pins and              piercings  Do not wear jewelry, lotions, powders or perfumes, deodorant             Men may shave face and neck.   Do not bring valuables to the hospital. [place].  Contacts, dentures or bridgework may not be worn into surgery.  Leave suitcase in the car. After surgery it may be brought to your room.     Patients discharged the day of surgery will not be allowed to drive home. IF YOU ARE HAVING SURGERY AND GOING HOME THE SAME DAY, YOU MUST HAVE AN ADULT TO DRIVE YOU HOME AND BE WITH YOU FOR 24 HOURS. YOU MAY GO HOME BY TAXI OR UBER OR ORTHERWISE, BUT AN ADULT MUST ACCOMPANY YOU HOME AND STAY WITH YOU FOR 24 HOURS.  Name and phone number of your driver:  Special Instructions: N/A              Please read over the following fact sheets you were given: _____________________________________________________________________          [facility] - Preparing for Surgery Before surgery, you can play an important role.  Because skin is not sterile, your skin needs to be as free of germs as possible.  You can reduce the number of germs on your skin by washing with CHG (chlorahexidine gluconate) soap before surgery.  CHG is an antiseptic cleaner which kills germs and bonds with the skin to continue killing germs even after washing. Please DO NOT use if you have an allergy to CHG or antibacterial soaps.  If your skin becomes reddened/irritated stop using the CHG and inform your nurse when you arrive at Short Stay. Do not shave (including legs and underarms) for at least 48 hours prior to the first CHG shower.  You may shave your face/neck. Please follow these instructions carefully:  1.  Shower with CHG Soap the night before surgery and the  morning of Surgery.  2.  If you choose to wash your hair, wash your hair first as  usual with your  normal  shampoo.  3.  After you shampoo, rinse your hair and body thoroughly to remove the  shampoo.                           4.  Use CHG as you would any other liquid soap.  You can apply chg directly  to the skin and wash                       Gently with a scrungie or clean washcloth.  5.  Apply the CHG Soap to your body ONLY FROM THE NECK DOWN.   Do not use on face/ open                           Wound or open sores. Avoid contact with eyes, ears mouth and genitals (private parts).                       Wash face,  Genitals (private parts) with your normal soap.             6.  Wash thoroughly, paying special attention to the area where your surgery  will be performed.  7.  Thoroughly rinse your body with warm water from the neck down.  8.  DO NOT shower/wash with your normal soap after using and rinsing off  the CHG Soap.                9.  Pat yourself dry with a clean towel.            10.  Wear clean pajamas.            11.  Place clean sheets on your bed the night of your first shower and do not  sleep with pets. Day of Surgery : Do not apply any lotions/deodorants the morning of surgery.  Please wear clean clothes to the hospital/surgery center.  FAILURE TO FOLLOW THESE INSTRUCTIONS MAY RESULT IN THE CANCELLATION OF YOUR SURGERY PATIENT SIGNATURE_________________________________  NURSE SIGNATURE__________________________________  ________________________________________________________________________

## 2021-01-19 ENCOUNTER — Encounter (HOSPITAL_COMMUNITY): Payer: Self-pay

## 2021-01-19 ENCOUNTER — Encounter (HOSPITAL_COMMUNITY)
Admission: RE | Admit: 2021-01-19 | Discharge: 2021-01-19 | Disposition: A | Discharge status: Home / Self Care | Payer: Medicare PPO | Source: Ambulatory Visit | Attending: Surgery | Admitting: Surgery

## 2021-01-19 ENCOUNTER — Other Ambulatory Visit: Payer: Self-pay

## 2021-01-19 DIAGNOSIS — Z01812 Encounter for preprocedural laboratory examination: Secondary | ICD-10-CM | POA: Insufficient documentation

## 2021-01-19 HISTORY — DX: Anxiety disorder, unspecified: F41.9

## 2021-01-19 HISTORY — DX: Depression, unspecified: F32.A

## 2021-01-19 LAB — CBC
HCT: 46.7 % (ref 39.0–52.0)
Hemoglobin: 16.5 g/dL (ref 13.0–17.0)
MCH: 33 pg (ref 26.0–34.0)
MCHC: 35.3 g/dL (ref 30.0–36.0)
MCV: 93.4 fL (ref 80.0–100.0)
Platelets: 148 10*3/uL — ABNORMAL LOW (ref 150–400)
RBC: 5 MIL/uL (ref 4.22–5.81)
RDW: 12.8 % (ref 11.5–15.5)
WBC: 7.7 10*3/uL (ref 4.0–10.5)
nRBC: 0 % (ref 0.0–0.2)

## 2021-01-19 NOTE — Progress Notes (Addendum)
COVID Vaccine Completed: Yes Date COVID Vaccine completed: 12/03/20 COVID vaccine manufacturer: Pfizer      PCP - Dr. Bernerd Limbo Cardiologist -   Chest x-ray -  EKG -  Stress Test -  ECHO -  Cardiac Cath -  Pacemaker/ICD device last checked:  Sleep Study - Yes CPAP - Yes  Fasting Blood Sugar -  Checks Blood Sugar _____ times a day  Blood Thinner Instructions: Aspirin Instructions: Last Dose:  Anesthesia review:   Patient denies shortness of breath, fever, cough and chest pain at PAT appointment   Patient verbalized understanding of instructions that were given to them at the PAT appointment. Patient was also instructed that they will need to review over the PAT instructions again at home before surgery.

## 2021-01-22 NOTE — Progress Notes (Signed)
Instructed patient to arrive at 1020. Clear liquids until 0900. Chris Burton verbalized understanding.

## 2021-01-25 MED ORDER — BUPIVACAINE LIPOSOME 1.3 % IJ SUSP
20.0000 mL | Freq: Once | INTRAMUSCULAR | Status: DC
  Filled 2021-01-25: qty 20

## 2021-01-25 NOTE — H&P (Signed)
Chief Complaint:  Bilateral inguinal herniae R>L  History of Present Illness:  Chris Burton is an 80 y.o. male who exercises regularly and has developed bilateral inguinal herniae R>L.  He saw a doctor in [place] where he was doing some archeology who recommended repair so he flew back home for repair.  He was seen in the office on May 19 when TAP repair with the robot was discussed and informed consent obtained.  He has no prior abdominal surgery.    Past Medical History:  Diagnosis Date  . Anxiety   . Arthritis   . Cancer [facility])    prostate  . Chronic kidney disease    Dr Felicity Pellegrini, [facility]. Reduce kidney funtion  . Depression   . GERD (gastroesophageal reflux disease)   . Gout   . Hepatitis A    PMH  . Hx of adenomatous colonic polyps    Dr Olevia Perches  . Hyperlipidemia    LDL goal = < 100  . Skin cancer    Basal Cell, Dr Sherrye Payor  . Sleep apnea     Past Surgical History:  Procedure Laterality Date  . COLONOSCOPY W/ POLYPECTOMY  2004   Dr Olevia Perches  . CYSTOSCOPY  2010   to remove kidney stone  . ESOPHAGEAL DILATION  2007   Dr Olevia Perches  . EXTERNAL BEAM RADIATION     & SEED IMPLANTS FOR RPOSTATE CANCER  . IR GENERIC HISTORICAL  06/17/2016   IR US GUIDE VASC ACCESS RIGHT 06/17/2016 [facility]  . IR GENERIC HISTORICAL  06/17/2016   IR FLUORO GUIDE CV LINE RIGHT 06/17/2016 [facility]  . IR GENERIC HISTORICAL  06/30/2016   IR REMOVAL TUN CV CATH W/O FL 06/30/2016 Greggory Keen, MD [facility]  . TONSILLECTOMY      Current Facility-Administered Medications  Medication Dose Route Frequency Provider Last Rate Last Admin  . [START ON 01/26/2021] bupivacaine liposome (EXPAREL) 1.3 % injection 266 mg  20 mL Infiltration Once Johnathan Hausen, MD       Current Outpatient Medications  Medication Sig Dispense Refill  . betamethasone dipropionate 0.05 % cream Apply 1 application topically 2 (two) times daily as needed (irritation).    . cholecalciferol (VITAMIN D3) 25 MCG (1000  UNIT) tablet Take 1,000 Units by mouth daily.    . ciclopirox (PENLAC) 8 % solution Apply 1 application topically at bedtime as needed (nail fungus). Apply over nail and surrounding skin. Apply daily over previous coat. After seven (7) days, may remove with alcohol and continue cycle.    . esomeprazole (NEXIUM) 40 MG capsule Take 40 mg by mouth 2 (two) times daily before a meal.    . fluticasone (CUTIVATE) 0.05 % cream Apply 1 application topically 2 (two) times daily as needed (irritation).    . Magnesium Cl-Calcium Carbonate (SLOW-MAG PO) Take 1 tablet by mouth daily.    . Misc Natural Products (BRAINSTRONG MEMORY SUPPORT PO) Take 1 capsule by mouth in the morning and at bedtime.    . Misc Natural Products (GLUCOSAMINE CHOND MSM FORMULA PO) Take 1 tablet by mouth in the morning and at bedtime.    . probenecid (BENEMID) 500 MG tablet TAKE 1 TABLET TWICE A DAY (Patient taking differently: Take 500 mg by mouth 2 (two) times daily. TAKE 1 TABLET TWICE A DAY) 60 tablet 0  . Psyllium (FIBER) 0.52 G CAPS Take 2 capsules by mouth in the morning and at bedtime.    . tretinoin (RETIN-A) 0.05 % cream Apply 1  application topically at bedtime.    . valACYclovir (VALTREX) 500 MG tablet Take 1 tablet (500 mg total) by mouth 2 (two) times daily. (Patient taking differently: Take 500 mg by mouth daily as needed (outbreaks).) 20 tablet 0   Methylparaben Family History  Problem Relation Age of Onset  . Lung cancer Mother        SMOKER  . Prostate cancer Father   . Stomach cancer Sister   . Leukemia Paternal Grandmother   . Prostate cancer Paternal Grandfather   . Diabetes Maternal Grandmother   . Heart disease Neg Hx   . Kidney disease Neg Hx   . Stroke Neg Hx   . Colon cancer Neg Hx    Social History:   reports that he quit smoking about 60 years ago. He has never used smokeless tobacco. He reports that he does not drink alcohol and does not use drugs.   REVIEW OF SYSTEMS : Negative except for see  problem list.  He is a daily exerciser.   Physical Exam:   There were no vitals taken for this visit. There is no height or weight on file to calculate BMI.  Gen:  WDWN WM NAD  Neurological: Alert and oriented to person, place, and time. Motor and sensory function is grossly intact  Head: Normocephalic and atraumatic.  Eyes: Conjunctivae are normal. Pupils are equal, round, and reactive to light. No scleral icterus.  Neck: Normal range of motion. Neck supple. No tracheal deviation or thyromegaly present.  Cardiovascular:  SR without murmurs or gallops.  No carotid bruits Breast:  Not examined Respiratory: Effort normal.  No respiratory distress. No chest wall tenderness. Breath sounds normal.  No wheezes, rales or rhonchi.  Abdomen:  nontender GU:  Bilateral inguinal herniae Musculoskeletal: Normal range of motion. Extremities are nontender. No cyanosis, edema or clubbing noted Lymphadenopathy: No cervical, preauricular, postauricular or axillary adenopathy is present Skin: Skin is warm and dry. No rash noted. No diaphoresis. No erythema. No pallor. Pscyh: Normal mood and affect. Behavior is normal. Judgment and thought content normal.   LABORATORY RESULTS: No results found for this or any previous visit (from the past 48 hour(s)).   RADIOLOGY RESULTS: No results found.  Problem List: Patient Active Problem List   Diagnosis Date Noted  . Epididymitis 06/16/2016  . CKD (chronic kidney disease) stage 3, GFR 30-59 ml/min (HCC) 06/16/2016  . COLONIC POLYPS 06/16/2010  . VITAMIN D DEFICIENCY 08/03/2009  . RENAL INSUFFICIENCY 08/19/2008  . ELEVATED BLOOD PRESSURE WITHOUT DIAGNOSIS OF HYPERTENSION 07/25/2008  . SKIN CANCER, HX OF 07/25/2008  . HYPERLIPIDEMIA 06/12/2007  . Gout 06/12/2007  . PROSTATE CANCER, HX OF 06/12/2007  . NEPHROLITHIASIS, HX OF 06/12/2007    Assessment & Plan: Bilateral inguinal herniae for robotic TAP repairs.      [name] Done, MD, [facility] [phone] beeper [phone]  01/25/2021 8:00 AM

## 2021-01-26 ENCOUNTER — Ambulatory Visit (HOSPITAL_COMMUNITY): Payer: Medicare PPO | Admitting: Registered Nurse

## 2021-01-26 ENCOUNTER — Encounter (HOSPITAL_COMMUNITY): Payer: Self-pay | Admitting: Surgery

## 2021-01-26 ENCOUNTER — Encounter (HOSPITAL_COMMUNITY)
Admission: RE | Disposition: A | Discharge status: Home / Self Care | Payer: Self-pay | Source: Home / Self Care | Attending: Surgery

## 2021-01-26 ENCOUNTER — Observation Stay (HOSPITAL_COMMUNITY)
Admission: RE | Admit: 2021-01-26 | Discharge: 2021-01-27 | Disposition: A | Discharge status: Home / Self Care | Payer: Medicare PPO | Attending: Surgery | Admitting: Surgery

## 2021-01-26 DIAGNOSIS — Z85828 Personal history of other malignant neoplasm of skin: Secondary | ICD-10-CM | POA: Diagnosis not present

## 2021-01-26 DIAGNOSIS — Z79899 Other long term (current) drug therapy: Secondary | ICD-10-CM | POA: Diagnosis not present

## 2021-01-26 DIAGNOSIS — K4020 Bilateral inguinal hernia, without obstruction or gangrene, not specified as recurrent: Secondary | ICD-10-CM | POA: Diagnosis not present

## 2021-01-26 DIAGNOSIS — Z87891 Personal history of nicotine dependence: Secondary | ICD-10-CM | POA: Diagnosis not present

## 2021-01-26 DIAGNOSIS — Z8546 Personal history of malignant neoplasm of prostate: Secondary | ICD-10-CM | POA: Diagnosis not present

## 2021-01-26 HISTORY — PX: XI ROBOTIC ASSISTED INGUINAL HERNIA REPAIR WITH MESH: SHX6706

## 2021-01-26 HISTORY — PX: CYSTOSCOPY: SHX5120

## 2021-01-26 SURGERY — REPAIR, HERNIA, INGUINAL, ROBOT-ASSISTED, LAPAROSCOPIC, USING MESH
Anesthesia: General | Site: Urethra

## 2021-01-26 MED ORDER — FENTANYL CITRATE (PF) 100 MCG/2ML IJ SOLN
12.5000 ug | INTRAMUSCULAR | Status: DC | PRN
Start: 2021-01-26 — End: 2021-01-27

## 2021-01-26 MED ORDER — SODIUM CHLORIDE (PF) 0.9 % IJ SOLN
INTRAMUSCULAR | Status: AC
  Filled 2021-01-26: qty 10

## 2021-01-26 MED ORDER — ROCURONIUM BROMIDE 10 MG/ML (PF) SYRINGE
PREFILLED_SYRINGE | INTRAVENOUS | Status: DC | PRN
  Administered 2021-01-26: 20 mg via INTRAVENOUS
  Administered 2021-01-26: 60 mg via INTRAVENOUS
  Administered 2021-01-26: 20 mg via INTRAVENOUS
  Administered 2021-01-26: 30 mg via INTRAVENOUS
  Administered 2021-01-26: 40 mg via INTRAVENOUS

## 2021-01-26 MED ORDER — SCOPOLAMINE 1 MG/3DAYS TD PT72
1.0000 | MEDICATED_PATCH | TRANSDERMAL | Status: DC
  Administered 2021-01-26: 1.5 mg via TRANSDERMAL
  Filled 2021-01-26: qty 1

## 2021-01-26 MED ORDER — LABETALOL HCL 5 MG/ML IV SOLN
INTRAVENOUS | Status: DC | PRN
  Administered 2021-01-26: 5 mg via INTRAVENOUS

## 2021-01-26 MED ORDER — DEXAMETHASONE SODIUM PHOSPHATE 10 MG/ML IJ SOLN
INTRAMUSCULAR | Status: DC | PRN
  Administered 2021-01-26: 4 mg via INTRAVENOUS

## 2021-01-26 MED ORDER — CHLORHEXIDINE GLUCONATE CLOTH 2 % EX PADS: 6.0000 | MEDICATED_PAD | Freq: Once | CUTANEOUS | Status: DC

## 2021-01-26 MED ORDER — HYDROMORPHONE HCL 2 MG/ML IJ SOLN
INTRAMUSCULAR | Status: AC
  Filled 2021-01-26: qty 1

## 2021-01-26 MED ORDER — ONDANSETRON HCL 4 MG/2ML IJ SOLN
INTRAMUSCULAR | Status: AC
  Filled 2021-01-26: qty 2

## 2021-01-26 MED ORDER — ONDANSETRON 4 MG PO TBDP: 4.0000 mg | ORAL_TABLET | Freq: Four times a day (QID) | ORAL | Status: DC | PRN

## 2021-01-26 MED ORDER — FENTANYL CITRATE (PF) 100 MCG/2ML IJ SOLN
INTRAMUSCULAR | Status: AC
  Filled 2021-01-26: qty 2

## 2021-01-26 MED ORDER — LIDOCAINE 2% (20 MG/ML) 5 ML SYRINGE
INTRAMUSCULAR | Status: DC | PRN
  Administered 2021-01-26: 80 mg via INTRAVENOUS

## 2021-01-26 MED ORDER — ORAL CARE MOUTH RINSE: 15.0000 mL | Freq: Once | OROMUCOSAL | Status: AC

## 2021-01-26 MED ORDER — HYDROMORPHONE HCL 1 MG/ML IJ SOLN
0.2500 mg | INTRAMUSCULAR | Status: DC | PRN
Start: 2021-01-26 — End: 2021-01-26

## 2021-01-26 MED ORDER — BUPIVACAINE LIPOSOME 1.3 % IJ SUSP
INTRAMUSCULAR | Status: DC | PRN
  Administered 2021-01-26: 20 mL

## 2021-01-26 MED ORDER — CEFAZOLIN SODIUM-DEXTROSE 2-4 GM/100ML-% IV SOLN
2.0000 g | INTRAVENOUS | Status: AC
  Administered 2021-01-26: 2 g via INTRAVENOUS
  Filled 2021-01-26: qty 100

## 2021-01-26 MED ORDER — MEPERIDINE HCL 50 MG/ML IJ SOLN: 6.2500 mg | INTRAMUSCULAR | Status: DC | PRN

## 2021-01-26 MED ORDER — LABETALOL HCL 5 MG/ML IV SOLN
INTRAVENOUS | Status: AC
  Filled 2021-01-26: qty 4

## 2021-01-26 MED ORDER — SUGAMMADEX SODIUM 200 MG/2ML IV SOLN
INTRAVENOUS | Status: DC | PRN
  Administered 2021-01-26: 200 mg via INTRAVENOUS

## 2021-01-26 MED ORDER — PROPOFOL 10 MG/ML IV BOLUS
INTRAVENOUS | Status: AC
  Filled 2021-01-26: qty 20

## 2021-01-26 MED ORDER — ONDANSETRON HCL 4 MG/2ML IJ SOLN: 4.0000 mg | Freq: Four times a day (QID) | INTRAMUSCULAR | Status: DC | PRN

## 2021-01-26 MED ORDER — KCL IN DEXTROSE-NACL 20-5-0.45 MEQ/L-%-% IV SOLN
INTRAVENOUS | Status: DC
  Filled 2021-01-26: qty 1000

## 2021-01-26 MED ORDER — DROPERIDOL 2.5 MG/ML IJ SOLN: 0.6250 mg | Freq: Once | INTRAMUSCULAR | Status: DC | PRN

## 2021-01-26 MED ORDER — HYDROCODONE-ACETAMINOPHEN 5-325 MG PO TABS
1.0000 | ORAL_TABLET | ORAL | Status: DC | PRN
  Administered 2021-01-27: 1 via ORAL
  Filled 2021-01-26: qty 1

## 2021-01-26 MED ORDER — ACETAMINOPHEN 500 MG PO TABS
1000.0000 mg | ORAL_TABLET | ORAL | Status: AC
Start: 2021-01-26 — End: 2021-01-26
  Administered 2021-01-26: 1000 mg via ORAL
  Filled 2021-01-26: qty 2

## 2021-01-26 MED ORDER — 0.9 % SODIUM CHLORIDE (POUR BTL) OPTIME
TOPICAL | Status: DC | PRN
  Administered 2021-01-26: 1000 mL

## 2021-01-26 MED ORDER — CHLORHEXIDINE GLUCONATE 0.12 % MT SOLN
15.0000 mL | Freq: Once | OROMUCOSAL | Status: AC
  Administered 2021-01-26: 15 mL via OROMUCOSAL

## 2021-01-26 MED ORDER — HYDROMORPHONE HCL 1 MG/ML IJ SOLN
INTRAMUSCULAR | Status: DC | PRN
  Administered 2021-01-26 (×2): .5 mg via INTRAVENOUS

## 2021-01-26 MED ORDER — ONDANSETRON HCL 4 MG/2ML IJ SOLN
INTRAMUSCULAR | Status: DC | PRN
  Administered 2021-01-26: 4 mg via INTRAVENOUS

## 2021-01-26 MED ORDER — DEXAMETHASONE SODIUM PHOSPHATE 10 MG/ML IJ SOLN
INTRAMUSCULAR | Status: AC
  Filled 2021-01-26: qty 1

## 2021-01-26 MED ORDER — DIATRIZOATE MEGLUMINE 30 % UR SOLN
URETHRAL | Status: AC
  Filled 2021-01-26: qty 100

## 2021-01-26 MED ORDER — LACTATED RINGERS IV SOLN: INTRAVENOUS | Status: DC

## 2021-01-26 MED ORDER — EPHEDRINE SULFATE-NACL 50-0.9 MG/10ML-% IV SOSY
PREFILLED_SYRINGE | INTRAVENOUS | Status: DC | PRN
  Administered 2021-01-26 (×2): 10 mg via INTRAVENOUS

## 2021-01-26 MED ORDER — PHENYLEPHRINE HCL-NACL 20-0.9 MG/250ML-% IV SOLN
INTRAVENOUS | Status: DC | PRN
  Administered 2021-01-26: 35 ug/min via INTRAVENOUS

## 2021-01-26 MED ORDER — FENTANYL CITRATE (PF) 100 MCG/2ML IJ SOLN
INTRAMUSCULAR | Status: DC | PRN
  Administered 2021-01-26 (×2): 50 ug via INTRAVENOUS

## 2021-01-26 MED ORDER — PROPOFOL 10 MG/ML IV BOLUS
INTRAVENOUS | Status: DC | PRN
  Administered 2021-01-26: 140 mg via INTRAVENOUS

## 2021-01-26 MED ORDER — DIPHENHYDRAMINE HCL 50 MG/ML IJ SOLN
INTRAMUSCULAR | Status: DC | PRN
  Administered 2021-01-26: 12.5 mg via INTRAVENOUS

## 2021-01-26 MED ORDER — PANTOPRAZOLE SODIUM 40 MG IV SOLR
40.0000 mg | Freq: Every day | INTRAVENOUS | Status: DC
  Administered 2021-01-26: 40 mg via INTRAVENOUS
  Filled 2021-01-26: qty 40

## 2021-01-26 MED ORDER — ROCURONIUM BROMIDE 10 MG/ML (PF) SYRINGE
PREFILLED_SYRINGE | INTRAVENOUS | Status: AC
  Filled 2021-01-26: qty 10

## 2021-01-26 MED ORDER — METOPROLOL TARTRATE 5 MG/5ML IV SOLN: 5.0000 mg | Freq: Four times a day (QID) | INTRAVENOUS | Status: DC | PRN

## 2021-01-26 MED ORDER — LIDOCAINE 2% (20 MG/ML) 5 ML SYRINGE
INTRAMUSCULAR | Status: AC
  Filled 2021-01-26: qty 5

## 2021-01-26 SURGICAL SUPPLY — 71 items
ADH SKN CLS APL DERMABOND .7 (GAUZE/BANDAGES/DRESSINGS) ×2
APL PRP STRL LF DISP 70% ISPRP (MISCELLANEOUS) ×2
APL SWBSTK 6 STRL LF DISP (MISCELLANEOUS) ×4
APPLICATOR COTTON TIP 6 STRL (MISCELLANEOUS) ×4 IMPLANT
APPLICATOR COTTON TIP 6IN STRL (MISCELLANEOUS) ×6
BAG DRN RND TRDRP ANRFLXCHMBR (UROLOGICAL SUPPLIES) ×2
BAG URINE DRAIN 2000ML AR STRL (UROLOGICAL SUPPLIES) ×1 IMPLANT
BALLN NEPHROSTOMY (BALLOONS) ×3
BALLOON NEPHROSTOMY (BALLOONS) IMPLANT
BLADE SURG 15 STRL LF DISP TIS (BLADE) ×2 IMPLANT
BLADE SURG 15 STRL SS (BLADE) ×3
CANNULA REDUC XI 12-8 STAPL (CANNULA) ×3
CANNULA REDUCER 12-8 DVNC XI (CANNULA) IMPLANT
CATH FOLEY 2W COUNCIL 5CC 16FR (CATHETERS) ×1 IMPLANT
CHLORAPREP W/TINT 26 (MISCELLANEOUS) ×3 IMPLANT
COVER MAYO STAND STRL (DRAPES) ×2 IMPLANT
COVER SURGICAL LIGHT HANDLE (MISCELLANEOUS) ×3 IMPLANT
COVER TIP SHEARS 8 DVNC (MISCELLANEOUS) ×2 IMPLANT
COVER TIP SHEARS 8MM DA VINCI (MISCELLANEOUS) ×3
DECANTER SPIKE VIAL GLASS SM (MISCELLANEOUS) ×3 IMPLANT
DERMABOND ADVANCED (GAUZE/BANDAGES/DRESSINGS) ×1
DERMABOND ADVANCED .7 DNX12 (GAUZE/BANDAGES/DRESSINGS) IMPLANT
DRAPE ARM DVNC X/XI (DISPOSABLE) ×6 IMPLANT
DRAPE COLUMN DVNC XI (DISPOSABLE) ×2 IMPLANT
DRAPE DA VINCI XI ARM (DISPOSABLE) ×12
DRAPE DA VINCI XI COLUMN (DISPOSABLE) ×3
DRSG TEGADERM 8X12 (GAUZE/BANDAGES/DRESSINGS) ×1 IMPLANT
ELECT REM PT RETURN 15FT ADLT (MISCELLANEOUS) ×3 IMPLANT
GLOVE SURG ENC MOIS LTX SZ7 (GLOVE) ×1 IMPLANT
GLOVE SURG ENC MOIS LTX SZ8 (GLOVE) ×1 IMPLANT
GLOVE SURG ENC TEXT LTX SZ8 (GLOVE) ×8 IMPLANT
GLOVE SURG LTX SZ7 (GLOVE) ×1 IMPLANT
GLOVE SURG LTX SZ8 (GLOVE) ×2 IMPLANT
GOWN STRL REUS W/ TWL LRG LVL3 (GOWN DISPOSABLE) IMPLANT
GOWN STRL REUS W/TWL LRG LVL3 (GOWN DISPOSABLE) ×3
GOWN STRL REUS W/TWL XL LVL3 (GOWN DISPOSABLE) ×12 IMPLANT
GRASPER SUT TROCAR 14GX15 (MISCELLANEOUS) ×1 IMPLANT
GUIDEWIRE STR DUAL SENSOR (WIRE) ×1 IMPLANT
KIT BASIN OR (CUSTOM PROCEDURE TRAY) ×3 IMPLANT
KIT TURNOVER KIT A (KITS) ×3 IMPLANT
MESH 3DMAX 4X6 LT LRG (Mesh General) ×1 IMPLANT
MESH 3DMAX 4X6 RT LRG (Mesh General) ×1 IMPLANT
NEEDLE HYPO 22GX1.5 SAFETY (NEEDLE) ×3 IMPLANT
OBTURATOR OPTICAL STANDARD 8MM (TROCAR)
OBTURATOR OPTICAL STND 8 DVNC (TROCAR)
OBTURATOR OPTICALSTD 8 DVNC (TROCAR) IMPLANT
PACK CARDIOVASCULAR III (CUSTOM PROCEDURE TRAY) ×3 IMPLANT
PAD POSITIONING PINK XL (MISCELLANEOUS) ×3 IMPLANT
SCISSORS LAP 5X35 DISP (ENDOMECHANICALS) IMPLANT
SEAL CANN UNIV 5-8 DVNC XI (MISCELLANEOUS) ×6 IMPLANT
SEAL XI 5MM-8MM UNIVERSAL (MISCELLANEOUS) ×12
SET IRRIG TUBING LAPAROSCOPIC (IRRIGATION / IRRIGATOR) ×3 IMPLANT
SOL ANTI FOG 6CC (MISCELLANEOUS) ×2 IMPLANT
SOLUTION ANTI FOG 6CC (MISCELLANEOUS) ×1
SOLUTION ELECTROLUBE (MISCELLANEOUS) ×3 IMPLANT
SPONGE LAP 18X18 RF (DISPOSABLE) ×3 IMPLANT
SUT MNCRL AB 4-0 PS2 18 (SUTURE) ×3 IMPLANT
SUT VIC AB 2-0 SH 27 (SUTURE) ×3
SUT VIC AB 2-0 SH 27X BRD (SUTURE) IMPLANT
SUT VIC AB 3-0 SH 27 (SUTURE)
SUT VIC AB 3-0 SH 27XBRD (SUTURE) IMPLANT
SUT VLOC 180 2-0 6IN GS21 (SUTURE) ×2 IMPLANT
SUT VLOC 180 2-0 9IN GS21 (SUTURE) IMPLANT
SYR 10ML ECCENTRIC (SYRINGE) ×3 IMPLANT
SYR 20ML LL LF (SYRINGE) ×3 IMPLANT
TAPE STRIPS DRAPE STRL (GAUZE/BANDAGES/DRESSINGS) ×3 IMPLANT
TOWEL OR 17X26 10 PK STRL BLUE (TOWEL DISPOSABLE) ×3 IMPLANT
TOWEL OR NON WOVEN STRL DISP B (DISPOSABLE) ×3 IMPLANT
TROCAR ADV FIXATION 12X100MM (TROCAR) ×1 IMPLANT
TROCAR BLADELESS OPT 5 100 (ENDOMECHANICALS) ×3 IMPLANT
TUBING INSUFFLATION 10FT LAP (TUBING) ×3 IMPLANT

## 2021-01-26 NOTE — Anesthesia Preprocedure Evaluation (Addendum)
Anesthesia Evaluation  Patient identified by MRN, date of birth, ID band Patient awake    Reviewed: Allergy & Precautions, NPO status , Patient's Chart, lab work & pertinent test results  Airway Mallampati: II  TM Distance: >3 FB Neck ROM: Full    Dental  (+) Dental Advisory Given, Teeth Intact   Pulmonary sleep apnea , former smoker,    Pulmonary exam normal breath sounds clear to auscultation       Cardiovascular negative cardio ROS Normal cardiovascular exam Rhythm:Regular Rate:Normal     Neuro/Psych PSYCHIATRIC DISORDERS Anxiety Depression negative neurological ROS     GI/Hepatic GERD  ,(+) Hepatitis -, A  Endo/Other  negative endocrine ROS  Renal/GU Renal disease     Musculoskeletal  (+) Arthritis ,   Abdominal   Peds  Hematology negative hematology ROS (+)   Anesthesia Other Findings   Reproductive/Obstetrics                            Anesthesia Physical Anesthesia Plan  ASA: II  Anesthesia Plan: General   Post-op Pain Management:    Induction: Intravenous  PONV Risk Score and Plan: 4 or greater and Ondansetron, Dexamethasone, Treatment may vary due to age or medical condition and Diphenhydramine  Airway Management Planned: Oral ETT  Additional Equipment: None  Intra-op Plan:   Post-operative Plan: Extubation in OR  Informed Consent: I have reviewed the patients History and Physical, chart, labs and discussed the procedure including the risks, benefits and alternatives for the proposed anesthesia with the patient or authorized representative who has indicated his/her understanding and acceptance.     Dental advisory given  Plan Discussed with: CRNA  Anesthesia Plan Comments:        Anesthesia Quick Evaluation

## 2021-01-26 NOTE — Anesthesia Postprocedure Evaluation (Signed)
Anesthesia Post Note  Patient: Chris Burton  Procedure(s) Performed: XI ROBOTIC ASSISTED BILATERAL INGUINAL HERNIA REPAIR (Bilateral Abdomen) CYSTOSCOPY FLEXIBLE WITH BALLOON DILATION FOR URETHRAL STRICTURE (N/A Urethra)     Patient location during evaluation: PACU Anesthesia Type: General Level of consciousness: sedated and patient cooperative Pain management: pain level controlled Vital Signs Assessment: post-procedure vital signs reviewed and stable Respiratory status: spontaneous breathing Cardiovascular status: stable Anesthetic complications: no   No complications documented.  Last Vitals:  Vitals:   01/26/21 2030 01/26/21 2045  BP: (!) 142/87 (!) 157/98  Pulse: 82 81  Resp: 14 13  Temp:    SpO2: 97% 99%    Last Pain:  Vitals:   01/26/21 1113  TempSrc:   PainSc: 0-No pain                 Nolon Nations

## 2021-01-26 NOTE — Op Note (Signed)
Chris Burton STATES  02/25/1941   01/26/2021    PCP:  Bernerd Limbo, MD   Surgeon: Chris Lim, MD, FACS  Asst:  Chris Pickerel, MD, FACS & Chris Montgomery, MD  Anes:  general  Preop Dx: Bilateral inguinal herniae Postop Dx: Bilateral pantaloon herniae--direct and indirect  Procedure: Robotic Xi repair of bilateral herniae with 3 D Max mesh Location Surgery: [facility] OR 2 Complications: Urethral stricture noted  EBL:   minimal cc  Drains: None except Foley placed by Dr. Claudia Burton for RT induced urethral stricture  Description of Procedure:  The patient was taken to OR 2 .  After anesthesia was administered and the patient was prepped  and attemps were made to place a foley.  Dr. Claudia Burton came and performed cystoscopy and found a stricture which she dilated and left a foley.  He was prepped with chloroprep  and a timeout was performed.  Access was achieved with a 5 mm in the left upper quadrant which subsequently dilated to 8 mm and then placed 28 mm trochars and a 12 on the right side for mesh installation.  The robot was docked without difficulty and with insufflation we surveyed the abdomen and found bilateral pantaloon hernias.  He's had previous radioactive seeds implanted as well as external beam radiation in 1998 for prostate cancer.  The right flap was created first and this was carried down carefully in preserving the peritoneal lining.  I carried this down medially to identify the pubis and was careful with his history of radiation. There was found to have a very large indirect sac and also a medial sac wasn't quite as large on the right side.  There was a very large indirect sac which took some time to dissected free from the cord structures but we got it down completely and then out took care to create a very large pocket for the subsequent mesh implantation.  Subsequently worked on the left side again creating a flap on the left.  This down and found a smaller indirect hernia and a larger  direct hernia on the left and got both of those mobilized.  I took time to create a large pocket for mesh on this side and connected the 2 so I could overlap the mesh.  The mesh was then placed on the right side first and secured medially with this simple Vicryl along the pubic bone.  It was tacked anterior laterally with another Vicryl which held everything in place.  This was enlarged regular 3-D max mesh.  On the left side a similar plate occurred with the 3-D Max mesh left, sutured overlapping the right and sutured together and then again tacked laterally with the idea Vicryl suture.  When this was complete the defects of the peritoneum were then closed with a 6 inch V lock running suture to approximate the peritoneum which appeared to be a nice closure without holes.  The robot was then undocked.  The 12 mm had a anterior fascial suture placed and the skin was closed with 4 -0, Monocryl and thenDermabond  The patient tolerated the procedure well and was taken to the PACU in stable condition.     Chris Burton Done, [name], [name], [name]

## 2021-01-26 NOTE — Interval H&P Note (Signed)
History and Physical Interval Note:  01/26/2021 2:05 PM  Chris Burton  has presented today for surgery, with the diagnosis of bilateral inguinal hernia.  The various methods of treatment have been discussed with the patient and family. After consideration of risks, benefits and other options for treatment, the patient has consented to  Procedure(s): XI ROBOTIC [place] (Bilateral) as a surgical intervention.  The patient's history has been reviewed, patient examined, no change in status, stable for surgery.  I have reviewed the patient's chart and labs.  Questions were answered to the patient's satisfaction.     [name]

## 2021-01-26 NOTE — Anesthesia Procedure Notes (Signed)
Procedure Name: Intubation Date/Time: 01/26/2021 3:06 PM Performed by: Niel Hummer, CRNA Pre-anesthesia Checklist: Patient identified, Emergency Drugs available, Suction available and Patient being monitored Patient Re-evaluated:Patient Re-evaluated prior to induction Oxygen Delivery Method: Circle system utilized Preoxygenation: Pre-oxygenation with 100% oxygen Induction Type: IV induction Ventilation: Mask ventilation without difficulty Laryngoscope Size: Mac and 4 Grade View: Grade II Tube type: Oral Tube size: 7.5 mm Number of attempts: 1 Airway Equipment and Method: Stylet Placement Confirmation: ETT inserted through vocal cords under direct vision,  positive ETCO2 and breath sounds checked- equal and bilateral Secured at: 24 cm Tube secured with: Tape Dental Injury: Teeth and Oropharynx as per pre-operative assessment

## 2021-01-26 NOTE — Transfer of Care (Signed)
Immediate Anesthesia Transfer of Care Note  Patient: Chris Burton  Procedure(s) Performed: Procedure(s): XI ROBOTIC ASSISTED BILATERAL INGUINAL HERNIA REPAIR (Bilateral) CYSTOSCOPY FLEXIBLE WITH BALLOON DILATION FOR URETHRAL STRICTURE (N/A)  Patient Location: PACU  Anesthesia Type:General  Level of Consciousness: Alert, Awake, Oriented  Airway & Oxygen Therapy: Patient Spontanous Breathing  Post-op Assessment: Report given to RN  Post vital signs: Reviewed and stable  Last Vitals:  Vitals:   01/26/21 1107  BP: (!) 145/92  Pulse: 67  Resp: 16  Temp: 36.5 C  SpO2: 77%    Complications: No apparent anesthesia complications

## 2021-01-26 NOTE — Op Note (Signed)
Operative Note  Preoperative diagnosis:  1.  Difficult foley catheterization  Postoperative diagnosis: 1.  Prostatic urethral stricture   Procedure(s): 1.  Cystoscopy, urethral balloon dilation, difficult foley placement   Surgeon: Jacalyn Lefevre, MD  Assistants:  None  Anesthesia:  General  Complications:  None  EBL:  0  Specimens: 1. none  Drains/Catheters: 1.  16Fr foley catheter  Intraoperative findings:   1. 6Fr posterior urethral stricture   Indication:  Chris Burton is a 80 y.o. male with b/l inguinal hernia in OR with general surgery.  Urology consulted for difficult foley insertion.  Description of procedure:  Patient was already under general anesthesia at time of consult. OR nurse attmpted 16Fr and 18Fr coude foley.    17Fr flexible cystoscope was placed in urethral meatus.  There was very narrow, dense stricture seen in posterior urethra.  Unsure how far to bladder neck.    A 0.38 sensor wire was then placed through the cystoscope into the bladder.  The ultraxx balloon dilator was used to dilated the stricture.  The dilator was removed and a 16Fr council tip foley was placed over the wire.  Return of clear yellow urine was seen and the foley was inflated with 10 cc sterile water.  The patient was turned back over to general surgery.  Plan: Continue foley for 3 days and follow up in office for void trial.

## 2021-01-27 ENCOUNTER — Other Ambulatory Visit: Payer: Self-pay

## 2021-01-27 ENCOUNTER — Encounter (HOSPITAL_COMMUNITY): Payer: Self-pay | Admitting: Surgery

## 2021-01-27 DIAGNOSIS — K4020 Bilateral inguinal hernia, without obstruction or gangrene, not specified as recurrent: Secondary | ICD-10-CM | POA: Diagnosis not present

## 2021-01-27 MED ORDER — HYDROCODONE-ACETAMINOPHEN 5-325 MG PO TABS: 1.0000 | ORAL_TABLET | ORAL | 0 refills | Status: DC | PRN

## 2021-01-27 MED ORDER — CHLORHEXIDINE GLUCONATE CLOTH 2 % EX PADS
6.0000 | MEDICATED_PAD | Freq: Every day | CUTANEOUS | Status: DC
  Administered 2021-01-27: 6 via TOPICAL

## 2021-01-27 NOTE — Progress Notes (Signed)
Patient discharged per orders by Dr. Hassell Done.   AVS summary printed and explained to pt.   IV removed and intact.   foley catheter empty and foley care was provided. The patient and wife were provided a demonstration on how to manage foley at home. All questions answered.     Pt not in any distress.   Belongings packed by NT Ramesh and sent with wife.    Nurse tech [place]ia [place] wheeled pt down with wheelchair.

## 2021-01-27 NOTE — Discharge Summary (Signed)
Physician Discharge Summary  Patient ID: Chris Burton MRN: [id number] DOB/AGE: [birth year] 80 y.o.  PCP: Bernerd Limbo, MD  Admit date: 01/26/2021 Discharge date: 01/27/2021  Admission Diagnoses:  Bilateral inguinal hernias  Discharge Diagnoses:  Bilateral pantaloon hernias containing a large right indirect hernia and a smaller right direct hernia and then on the left side was a larger left direct hernia and a smaller left indirect hernia.  Active Problems:   Inguinal hernia bilateral, non-recurrent   Surgery:  Robotic Repair (TAP) of bilateral hernias using Bard 3-D max mesh large  Discharged Condition: improved  Hospital Course:   The patient underwent surgery in the evening and required dilatation of urethral stricture probably from prior radiation for his prostate cancer.  He will be followed up by Dr. Claudia Desanctis for this.  Following this intervention he underwent a robotic bilateral hernia repair with 3-D max mesh.  He tolerated this well.  He was kept overnight.  His wounds looked good on the morning of discharge with no ecchymoses.  He was having minimal discomfort.  He'll be followed up in the office in 3 weeks  Consults: Dr. Claudia Desanctis in urology  Significant Diagnostic Studies: none    Discharge Exam: Blood pressure (!) 145/87, pulse 72, temperature (!) 97.4 F (36.3 C), temperature source Oral, resp. rate 18, height 5' 10.5" (1.791 m), weight 86.2 kg, SpO2 100 %. Incisions looked good and were covered with Dermabond.  Disposition:    Allergies as of 01/27/2021      Reactions   Methylparaben Rash   Positive on skin test      Medication List    TAKE these medications   betamethasone dipropionate 0.05 % cream Apply 1 application topically 2 (two) times daily as needed (irritation).   cholecalciferol 25 MCG (1000 UNIT) tablet Commonly known as: VITAMIN D3 Take 1,000 Units by mouth daily.   ciclopirox 8 % solution Commonly known as: PENLAC Apply 1 application topically  at bedtime as needed (nail fungus). Apply over nail and surrounding skin. Apply daily over previous coat. After seven (7) days, may remove with alcohol and continue cycle.   esomeprazole 40 MG capsule Commonly known as: NEXIUM Take 40 mg by mouth 2 (two) times daily before a meal.   Fiber 0.52 g Caps Take 2 capsules by mouth in the morning and at bedtime.   fluticasone 0.05 % cream Commonly known as: CUTIVATE Apply 1 application topically 2 (two) times daily as needed (irritation).   GLUCOSAMINE CHOND MSM FORMULA PO Take 1 tablet by mouth in the morning and at bedtime.   BRAINSTRONG MEMORY SUPPORT PO Take 1 capsule by mouth in the morning and at bedtime.   HYDROcodone-acetaminophen 5-325 MG tablet Commonly known as: NORCO/VICODIN Take 1-2 tablets by mouth every 4 (four) hours as needed for moderate pain.   probenecid 500 MG tablet Commonly known as: BENEMID TAKE 1 TABLET TWICE A DAY What changed:   how much to take  how to take this  when to take this   SLOW-MAG PO Take 1 tablet by mouth daily.   tretinoin 0.05 % cream Commonly known as: RETIN-A Apply 1 application topically at bedtime.   valACYclovir 500 MG tablet Commonly known as: VALTREX Take 1 tablet (500 mg total) by mouth 2 (two) times daily. What changed:   when to take this  reasons to take this       Follow-up Information    Chris Hausen, MD Follow up in 3 week(s).   Specialty: General  Surgery Contact information: [place] [address] [phone]               Signed: [name] 01/27/2021, 8:39 AM

## 2021-01-27 NOTE — Discharge Instructions (Signed)
Inguinal Hernia, Adult An inguinal hernia is when fat or your intestines push through a weak spot in a muscle where your leg meets your lower belly (groin). This causes a bulge. This kind of hernia could also be:  In your scrotum, if you are male.  In folds of skin around your vagina, if you are male. There are three types of inguinal hernias:  Hernias that can be pushed back into the belly (are reducible). This type rarely causes pain.  Hernias that cannot be pushed back into the belly (are incarcerated).  Hernias that cannot be pushed back into the belly and lose their blood supply (are strangulated). This type needs emergency surgery. What are the causes? This condition is caused by having a weak spot in the muscles or tissues in your groin. This develops over time. The hernia may poke through the weak spot when you strain your lower belly muscles all of a sudden, such as when you:  Lift a heavy object.  Strain to poop (have a bowel movement). Trouble pooping (constipation) can lead to straining.  Cough. What increases the risk? This condition is more likely to develop in:  Males.  Pregnant females.  People who: ? Are overweight. ? Work in jobs that require long periods of standing or heavy lifting. ? Have had an inguinal hernia before. ? Smoke or have lung disease. These factors can lead to long-term (chronic) coughing. What are the signs or symptoms? Symptoms may depend on the size of the hernia. Often, a small hernia has no symptoms. Symptoms of a larger hernia may include:  A bulge in the groin area. This is easier to see when standing. You might not be able to see it when you are lying down.  Pain or burning in the groin. This may get worse when you lift, strain, or cough.  A dull ache or a feeling of pressure in the groin.  An abnormal bulge in the scrotum, in males. Symptoms of a strangulated inguinal hernia may include:  A bulge in your groin that is very  painful and tender to the touch.  A bulge that turns red or purple.  Fever, feeling like you may vomit (nausea), and vomiting.  Not being able to poop or to pass gas. How is this treated? Treatment depends on the size of your hernia and whether you have symptoms. If you do not have symptoms, your doctor may have you watch your hernia carefully and have you come in for follow-up visits. If your hernia is large or if you have symptoms, you may need surgery to repair the hernia. Follow these instructions at home: Lifestyle  Avoid lifting heavy objects.  Avoid standing for long amounts of time.  Do not smoke or use any products that contain nicotine or tobacco. If you need help quitting, ask your doctor.  Stay at a healthy weight. Prevent trouble pooping You may need to take these actions to prevent or treat trouble pooping:  Drink enough fluid to keep your pee (urine) pale yellow.  Take over-the-counter or prescription medicines.  Eat foods that are high in fiber. These include beans, whole grains, and fresh fruits and vegetables.  Limit foods that are high in fat and sugar. These include fried or sweet foods. General instructions  You may try to push your hernia back in place by very gently pressing on it when you are lying down. Do not try to push the bulge back in if it will not go in   easily.  Watch your hernia for any changes in shape, size, or color. Tell your doctor if you see any changes.  Take over-the-counter and prescription medicines only as told by your doctor.  Keep all follow-up visits. Contact a doctor if:  You have a fever or chills.  You have new symptoms.  Your symptoms get worse. Get help right away if:  You have pain in your groin that gets worse all of a sudden.  You have a bulge in your groin that: ? Gets bigger all of a sudden, and it does not get smaller after that. ? Turns red or purple. ? Is painful when you touch it.  You are a male, and  you have: ? Sudden pain in your scrotum. ? A sudden change in the size of your scrotum.  You cannot push the hernia back in place by very gently pressing on it when you are lying down.  You feel like you may vomit, and that feeling does not go away.  You keep vomiting.  You have a fast heartbeat.  You cannot poop or pass gas. These symptoms may be an emergency. Get help right away. Call your local emergency services (911 in the [place]  Do not wait to see if the symptoms will go away.  Do not drive yourself to the hospital. Summary  An inguinal hernia is when fat or your intestines push through a weak spot in a muscle where your leg meets your lower belly (groin). This causes a bulge.  If you do not have symptoms, you may not need treatment. If you have symptoms or a large hernia, you may need surgery.  Avoid lifting heavy objects. Also, avoid standing for long amounts of time.  Do not try to push the bulge back in if it will not go in easily. This information is not intended to replace advice given to you by your health care provider. Make sure you discuss any questions you have with your health care provider. Document Revised: 04/14/2020 Document Reviewed: 04/14/2020 Elsevier Patient Education  2021 [employer]  

## 2021-09-01 ENCOUNTER — Other Ambulatory Visit: Payer: Self-pay | Admitting: Urology

## 2021-09-02 ENCOUNTER — Other Ambulatory Visit: Payer: Self-pay

## 2021-09-02 ENCOUNTER — Encounter (HOSPITAL_BASED_OUTPATIENT_CLINIC_OR_DEPARTMENT_OTHER): Payer: Self-pay | Admitting: Urology

## 2021-09-02 NOTE — Progress Notes (Signed)
Spoke w/ via phone for pre-op interview--- Chris Burton Lab needs dos----    NONE           Lab results------ COVID test -----patient states asymptomatic no test needed Arrive at -------1000 NPO after MN NO Solid Food.  Clear liquids from MN until--- Med rec completed Medications to take morning of surgery -----Nexium Diabetic medication ----- Patient instructed no nail polish to be worn day of surgery Patient instructed to bring photo id and insurance card day of surgery Patient aware to have Driver (ride ) / caregiver    for 24 hours after surgery  Patient Special Instructions ----- Pre-Op special Istructions ----- Patient verbalized understanding of instructions that were given at this phone interview. Patient denies shortness of breath, chest pain, fever, cough at this phone interview.

## 2021-09-06 NOTE — H&P (Signed)
CC/HPI: cc: urethral stricture   02/01/21: 81 year old man with a history of prostate cancer status post brachytherapy over 20 years ago who recently underwent bilateral inguinal hernia repair found to have urethral stricture intraoperatively during attempted Foley insertion. Cystoscopy with urethral balloon dilation was performed for recurrent prostatic urethral stricture. Per chart review, patient was last seen at [facility] in 2017 by Dr. Tresa Moore. He underwent cystoscopy with dilation of high-grade prostatic stricture at that time. After Foley catheter was removed he experienced new onset incontinence.   05/06/21: 81 year old man with the above history here for follow-up. His PVR today is 20 cc. He does feel like he has a stable force of stream. He has been paying attention to his stream more closely following urethral dilation. He has stable urinary leakage that has been present for many years.   09/01/21: 81 year old man with history of prostate cancer status post brachytherapy also with prostatic urethral stricture who has undergone urethral dilation x2 here for follow-up. He noticed that his stream has decreased over the last month. He has had to strain to fully empty his bladder. No hematuria. No increased urinary leakage. PVR 0 cc today.     ALLERGIES: Banana Methylparaben    MEDICATIONS: Nexium 40 mg capsule,delayed release  Nexium 40 mg capsule,delayed release 1 capsule PO Daily  Betamethasone Dipropionate 0.05 % cream 1 PO Daily  Ciclopirox 1 PO Daily  Fluticasone Propionate 1 PO Daily  Gabapentin 300 mg capsule  Glucosamine-Chondroitin 1 PO Daily  Probenecid 500 mg tablet 1 tablet PO Daily  Probenecid  Psyllium Fiber 0.4 gram capsule 1 capsule PO Daily  Retin-A 1 PO Daily  Slow-Mag 1 PO Daily  Stool Softener 1 PO Daily  Tylenol Extra Strength 500 mg tablet  Valacyclovir 500 mg tablet 1 tablet PO Daily  Vitamin D3 25 mcg (1,000 unit) capsule 1 capsule PO Daily     GU PSH:  Cysto Dilate Stricture (M or F) - 2017 Cystoscopy Insert Stent - 2012, 2012, 2012 Ureteroscopic stone removal - 2012       PSH Notes: Cystoscopy With Insertion Of Ureteral Stent Right, Cystoscopy With Ureteroscopy With Removal Of Calculus, Cystoscopy With Insertion Of Ureteral Stent Right, Cystoscopy With Insertion Of Ureteral Stent Right   NON-GU PSH: Tonsillectomy..     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 Smoked for 10 years. Smoked 2 packs per day.  Has never drank.  Drinks 2 caffeinated drinks per day. Patient's occupation is/was Retired.    REVIEW OF SYSTEMS:    GU Review Male:   Patient denies erection problems, penile pain, hard to postpone urination, burning/ pain with urination, leakage of urine, have to strain to urinate , trouble starting your stream, stream starts and stops, get up at night to urinate, and frequent urination.  Gastrointestinal (Upper):   Patient denies nausea,  vomiting, and indigestion/ heartburn.  Gastrointestinal (Lower):   Patient denies diarrhea and constipation.  Constitutional:   Patient denies fever, night sweats, weight loss, and fatigue.  Skin:   Patient denies skin rash/ lesion and itching.  Eyes:   Patient  denies blurred vision and double vision.  Ears/ Nose/ Throat:   Patient denies sore throat and sinus problems.  Hematologic/Lymphatic:   Patient denies swollen glands and easy bruising.  Cardiovascular:   Patient denies leg swelling and chest pains.  Respiratory:   Patient denies cough and shortness of breath.  Endocrine:   Patient denies excessive thirst.  Musculoskeletal:   Patient denies back pain and joint pain.  Neurological:   Patient denies headaches and dizziness.  Psychologic:   Patient denies depression and anxiety.   VITAL SIGNS:      09/01/2021 03:46 PM  Weight 190 lb / 86.18 kg  Height 70.5 in / 179.07 cm  BP 112/74 mmHg  Pulse 74 /min  Temperature 97.5 F / 36.3 C  BMI 26.9 kg/m   MULTI-SYSTEM PHYSICAL EXAMINATION:    Constitutional: Well-nourished. No physical deformities. Normally developed. Good grooming.  Neck: Neck symmetrical, not swollen. Normal tracheal position.  Respiratory: No labored breathing, no use of accessory muscles.   Skin: No paleness, no jaundice, no cyanosis. No lesion, no ulcer, no rash.  Neurologic / Psychiatric: Oriented to time, oriented to place, oriented to person. No depression, no anxiety, no agitation.  Gastrointestinal: No rigidity, non obese abdomen.   Eyes: Normal conjunctivae. Normal eyelids.  Ears, Nose, Mouth, and Throat: Left ear no scars, no lesions, no masses. Right ear no scars, no lesions, no masses. Nose no scars, no lesions, no masses. Normal hearing. Normal lips.  Musculoskeletal: Normal gait and station of head and neck.     Complexity of Data:  Records Review:   Previous Patient Records, POC Tool  Urine Test Review:   Urinalysis  Urodynamics Review:   Review Bladder Scan   PROCEDURES:          Urinalysis w/Scope Dipstick Dipstick Cont'd Micro  Color: Yellow Bilirubin: Neg mg/dL WBC/hpf: 10 - 20/hpf  Appearance: Clear Ketones: Neg mg/dL RBC/hpf: 0 - 2/hpf  Specific Gravity: 1.025 Blood: Neg ery/uL Bacteria: NS (Not  Seen)  pH: 5.5 Protein: Trace mg/dL Cystals: NS (Not Seen)  Glucose: Neg mg/dL Urobilinogen: 0.2 mg/dL Casts: NS (Not Seen)    Nitrites: Neg Trichomonas: Not Present    Leukocyte Esterase: 1+ leu/uL Mucous: Not Present      Epithelial Cells: NS (Not Seen)      Yeast: NS (Not Seen)      Sperm: Not Present    ASSESSMENT:      ICD-10 Details  1 GU:   Postprocedural urethral stricture, male, Unspec - N99.114 Chronic, Worsening  2   Prostate Cancer - C61 Chronic, Stable   PLAN:           Document Letter(s):  Created for Patient: Clinical Summary         Notes:   1. Prostatic urethral stricture: Risks, benefits and alternatives to cystoscopy with possible urethral balloon dilation discussed with the patient. He is leaving for [place] on 1/14. Given the short timeframe we discussed proceeding directly to the operating room. We discussed that urethral strictures tend to recur and this is likely what is happening and impacting his symptoms. Risks and benefits include but are not limited to bleeding, infection, pain, recurrence, need for additional procedure, need for Foley catheter following procedure,  damage to surrounding structures, worsening urinary incontinence. Patient understands he will Foley catheter for 3 days following the procedure.

## 2021-09-07 ENCOUNTER — Encounter (HOSPITAL_BASED_OUTPATIENT_CLINIC_OR_DEPARTMENT_OTHER): Payer: Self-pay | Admitting: Urology

## 2021-09-07 ENCOUNTER — Ambulatory Visit (HOSPITAL_BASED_OUTPATIENT_CLINIC_OR_DEPARTMENT_OTHER): Payer: Medicare PPO | Admitting: Certified Registered"

## 2021-09-07 ENCOUNTER — Other Ambulatory Visit: Payer: Self-pay

## 2021-09-07 ENCOUNTER — Ambulatory Visit (HOSPITAL_BASED_OUTPATIENT_CLINIC_OR_DEPARTMENT_OTHER)
Admission: RE | Admit: 2021-09-07 | Discharge: 2021-09-07 | Disposition: A | Discharge status: Home / Self Care | Payer: Medicare PPO | Attending: Urology | Admitting: Urology

## 2021-09-07 ENCOUNTER — Encounter (HOSPITAL_BASED_OUTPATIENT_CLINIC_OR_DEPARTMENT_OTHER)
Admission: RE | Disposition: A | Discharge status: Home / Self Care | Payer: Self-pay | Source: Home / Self Care | Attending: Urology

## 2021-09-07 DIAGNOSIS — N35919 Unspecified urethral stricture, male, unspecified site: Secondary | ICD-10-CM | POA: Diagnosis not present

## 2021-09-07 DIAGNOSIS — Z923 Personal history of irradiation: Secondary | ICD-10-CM | POA: Insufficient documentation

## 2021-09-07 DIAGNOSIS — Z8546 Personal history of malignant neoplasm of prostate: Secondary | ICD-10-CM | POA: Diagnosis not present

## 2021-09-07 HISTORY — PX: CYSTOSCOPY WITH URETHRAL DILATATION: SHX5125

## 2021-09-07 SURGERY — CYSTOSCOPY, WITH URETHRAL DILATION
Anesthesia: General | Site: Bladder

## 2021-09-07 MED ORDER — DEXAMETHASONE SODIUM PHOSPHATE 10 MG/ML IJ SOLN
INTRAMUSCULAR | Status: DC | PRN
  Administered 2021-09-07: 10 mg via INTRAVENOUS

## 2021-09-07 MED ORDER — ACETAMINOPHEN 10 MG/ML IV SOLN: 1000.0000 mg | Freq: Once | INTRAVENOUS | Status: DC | PRN

## 2021-09-07 MED ORDER — FENTANYL CITRATE (PF) 100 MCG/2ML IJ SOLN
INTRAMUSCULAR | Status: DC | PRN
Start: 2021-09-07 — End: 2021-09-07
  Administered 2021-09-07 (×4): 25 ug via INTRAVENOUS

## 2021-09-07 MED ORDER — LACTATED RINGERS IV SOLN: INTRAVENOUS | Status: DC

## 2021-09-07 MED ORDER — FENTANYL CITRATE (PF) 100 MCG/2ML IJ SOLN: 25.0000 ug | INTRAMUSCULAR | Status: DC | PRN

## 2021-09-07 MED ORDER — FENTANYL CITRATE (PF) 100 MCG/2ML IJ SOLN
INTRAMUSCULAR | Status: AC
  Filled 2021-09-07: qty 2

## 2021-09-07 MED ORDER — ONDANSETRON HCL 4 MG/2ML IJ SOLN
INTRAMUSCULAR | Status: AC
  Filled 2021-09-07: qty 2

## 2021-09-07 MED ORDER — LIDOCAINE 2% (20 MG/ML) 5 ML SYRINGE
INTRAMUSCULAR | Status: DC | PRN
  Administered 2021-09-07: 70 mg via INTRAVENOUS

## 2021-09-07 MED ORDER — CEFAZOLIN SODIUM-DEXTROSE 2-4 GM/100ML-% IV SOLN
2.0000 g | Freq: Once | INTRAVENOUS | Status: AC
  Administered 2021-09-07: 2 g via INTRAVENOUS

## 2021-09-07 MED ORDER — DEXAMETHASONE SODIUM PHOSPHATE 10 MG/ML IJ SOLN
INTRAMUSCULAR | Status: AC
  Filled 2021-09-07: qty 1

## 2021-09-07 MED ORDER — ONDANSETRON HCL 4 MG/2ML IJ SOLN
INTRAMUSCULAR | Status: DC | PRN
  Administered 2021-09-07: 4 mg via INTRAVENOUS

## 2021-09-07 MED ORDER — PROPOFOL 10 MG/ML IV BOLUS
INTRAVENOUS | Status: DC | PRN
Start: 2021-09-07 — End: 2021-09-07
  Administered 2021-09-07: 200 mg via INTRAVENOUS

## 2021-09-07 MED ORDER — CEFAZOLIN SODIUM-DEXTROSE 2-4 GM/100ML-% IV SOLN
INTRAVENOUS | Status: AC
  Filled 2021-09-07: qty 100

## 2021-09-07 MED ORDER — IOHEXOL 300 MG/ML  SOLN
INTRAMUSCULAR | Status: DC | PRN
  Administered 2021-09-07: 10 mL

## 2021-09-07 MED ORDER — CEPHALEXIN 500 MG PO CAPS: 500.0000 mg | ORAL_CAPSULE | Freq: Two times a day (BID) | ORAL | 0 refills | Status: AC

## 2021-09-07 MED ORDER — SODIUM CHLORIDE 0.9 % IR SOLN
Status: DC | PRN
  Administered 2021-09-07: 3000 mL via INTRAVESICAL

## 2021-09-07 SURGICAL SUPPLY — 23 items
BAG DRAIN URO-CYSTO SKYTR STRL (DRAIN) ×2 IMPLANT
BAG DRN RND TRDRP ANRFLXCHMBR (UROLOGICAL SUPPLIES) ×1
BAG DRN UROCATH (DRAIN) ×1
BAG URINE DRAIN 2000ML AR STRL (UROLOGICAL SUPPLIES) ×1 IMPLANT
BALLN NEPHROSTOMY (BALLOONS) ×2
BALLOON NEPHROSTOMY (BALLOONS) IMPLANT
CATH COUDE FOLEY 2W 5CC 16FR (CATHETERS) ×1 IMPLANT
CLOTH BEACON ORANGE TIMEOUT ST (SAFETY) ×2 IMPLANT
ELECT REM PT RETURN 9FT ADLT (ELECTROSURGICAL) ×2
ELECTRODE REM PT RTRN 9FT ADLT (ELECTROSURGICAL) ×1 IMPLANT
GLOVE SURG ENC MOIS LTX SZ6.5 (GLOVE) ×3 IMPLANT
GLOVE SURG NEOPR MICRO LF SZ6 (GLOVE) ×1 IMPLANT
GLOVE SURG UNDER POLY LF SZ6 (GLOVE) ×1 IMPLANT
GOWN STRL REUS W/TWL LRG LVL3 (GOWN DISPOSABLE) ×4 IMPLANT
GUIDEWIRE STR DUAL SENSOR (WIRE) ×1 IMPLANT
IV NS IRRIG 3000ML ARTHROMATIC (IV SOLUTION) ×1 IMPLANT
KIT TURNOVER CYSTO (KITS) ×3 IMPLANT
MANIFOLD NEPTUNE II (INSTRUMENTS) ×3 IMPLANT
PACK CYSTO (CUSTOM PROCEDURE TRAY) ×3 IMPLANT
TUBE CONNECTING 12X1/4 (SUCTIONS) ×3 IMPLANT
TUBING UROLOGY SET (TUBING) ×3 IMPLANT
WATER STERILE IRR 3000ML UROMA (IV SOLUTION) ×1 IMPLANT
ultraxx nephrostomy balloon ×1 IMPLANT

## 2021-09-07 NOTE — Anesthesia Preprocedure Evaluation (Signed)
Anesthesia Evaluation  Patient identified by MRN, date of birth, ID band Patient awake    Reviewed: Allergy & Precautions, NPO status , Patient's Chart, lab work & pertinent test results  History of Anesthesia Complications Negative for: history of anesthetic complications  Airway Mallampati: II  TM Distance: >3 FB Neck ROM: Full    Dental  (+) Dental Advisory Given, Teeth Intact, Partial Lower,    Pulmonary neg shortness of breath, sleep apnea , neg COPD, neg recent URI, former smoker,    breath sounds clear to auscultation       Cardiovascular negative cardio ROS   Rhythm:Regular     Neuro/Psych PSYCHIATRIC DISORDERS Anxiety Depression negative neurological ROS     GI/Hepatic GERD  ,(+) Hepatitis -, A  Endo/Other  negative endocrine ROS  Renal/GU Renal disease     Musculoskeletal  (+) Arthritis ,   Abdominal   Peds  Hematology negative hematology ROS (+)   Anesthesia Other Findings   Reproductive/Obstetrics                             Anesthesia Physical Anesthesia Plan  ASA: 2  Anesthesia Plan: General   Post-op Pain Management: Minimal or no pain anticipated   Induction: Intravenous  PONV Risk Score and Plan: 2 and Ondansetron and Dexamethasone  Airway Management Planned: LMA  Additional Equipment: None  Intra-op Plan:   Post-operative Plan: Extubation in OR  Informed Consent: I have reviewed the patients History and Physical, chart, labs and discussed the procedure including the risks, benefits and alternatives for the proposed anesthesia with the patient or authorized representative who has indicated his/her understanding and acceptance.     Dental advisory given  Plan Discussed with: CRNA and Anesthesiologist  Anesthesia Plan Comments:         Anesthesia Quick Evaluation

## 2021-09-07 NOTE — Interval H&P Note (Signed)
History and Physical Interval Note:  09/07/2021 10:34 AM  Chris Burton  has presented today for surgery, with the diagnosis of URETHRAL STRICTURE.  The various methods of treatment have been discussed with the patient and family. After consideration of risks, benefits and other options for treatment, the patient has consented to  Procedure(s): CYSTOSCOPY WITH URETHRAL BALLOON DILATATION (N/A) as a surgical intervention.  The patient's history has been reviewed, patient examined, no change in status, stable for surgery.  I have reviewed the patient's chart and labs.  Questions were answered to the patient's satisfaction.     Chris Burton

## 2021-09-07 NOTE — Op Note (Signed)
Operative Note  Preoperative diagnosis:  1.  Urethral stricture  Postoperative diagnosis: 1.  Urethral stricture  Procedure(s): 1.  Cystoscopy, urethral balloon dilation, foley placement  Surgeon: Jacalyn Lefevre, MD  Assistants:  None  Anesthesia:  General  Complications:  None  EBL:  minimal  Specimens: 1. None  Drains/Catheters: 1.  18Fr foley  Intraoperative findings:   Normal anterior urethra Recurrent urethral stricture seen at distal prostatic urethra - unable to traverse with cystoscope Bilateral orthotropic Uos with clear efflux Normal bladder mucosa Fixed prostatic urethra and bladder neck  Indication:  Chris Burton is a 81 y.o. male with history of prostate cancer s/p radiation who developed prostatic urethral stricture requiring urethral dilation previously.  Urinary symptoms have slowly returned concerning for stricture recurrence.   Description of procedure: After risks, benefits and alternatives were discussed with the patient, informed consent was obtained.  The patient was taking to the operating and placed in the supine position.    A 21 French rigid cystoscope was placed in the urethral meatus and advanced to the prostatic urethra.  At this point in time a narrow opening was seen and with gentle pressure the scope was not able to pass.  A 0.38 sensor wire was then used to cannulate this opening and fluoroscopy confirmed proximal end in the bladder.  The cystoscope was removed.  Next the Ultrex balloon dilator was then placed over the wire and advanced so that the radiopaque marker spanned the stricture.  It did take a little bit of gentle pressure to advance the balloon and the deflated state over the wire through the stricture.  The Ultrex balloon dilator was then inflated to 15 mmHg.  This was held in place for approximately 2 minutes.  It was then deflated and removed.   Next the rigid cystoscope was placed alongside the wire into the urethral meatus  and advanced into the bladder.  It was able to traverse the prostatic urethra at this point time.  Of note the prostatic urethra was extremely fixed as was the bladder neck somewhat limiting movement of the scope.  Inspection of the bladder did not reveal any abnormal mucosa.  The cystoscope was removed taking care to keep the wire in place.  An 79 French coud Foley catheter was then placed alongside the wire in the urethral meatus and advanced in the bladder to the hub.  It was inflated with 10 cc of sterile water.  The wire was then removed.  The catheter was hooked up to a drainage bag.  The patient emerged from anesthesia and sent to the PACU in stable condition.   Plan:   Plan for foley removal in the office in 3 days.  Patient is interested in possibly removing the catheter at home.  He will be given instructions on how to do this for Friday morning.  He will also stay on antibiotics for the next 3 days for prophylaxis.

## 2021-09-07 NOTE — Anesthesia Procedure Notes (Signed)
Procedure Name: LMA Insertion Date/Time: 09/07/2021 11:44 AM Performed by: Myna Bright, CRNA Pre-anesthesia Checklist: Patient identified, Emergency Drugs available, Suction available and Patient being monitored Patient Re-evaluated:Patient Re-evaluated prior to induction Oxygen Delivery Method: Circle system utilized Preoxygenation: Pre-oxygenation with 100% oxygen Induction Type: IV induction Ventilation: Mask ventilation without difficulty LMA: LMA inserted LMA Size: 4.0 Tube type: Oral Number of attempts: 1 Placement Confirmation: positive ETCO2 and breath sounds checked- equal and bilateral Tube secured with: Tape Dental Injury: Teeth and Oropharynx as per pre-operative assessment

## 2021-09-07 NOTE — Discharge Instructions (Addendum)
Post Bladder Surgery Instructions   General instructions:     Your recent bladder surgery requires very little post hospital care but some definite precautions.  Despite the fact that no skin incisions were used, the area around the bladder incisions are raw and covered with scabs to promote healing and prevent bleeding. Certain precautions are needed to insure that the scabs are not disturbed over the next 2-4 weeks while the healing proceeds.  Because the raw surface inside your bladder and the irritating effects of urine you may expect frequency of urination and/or urgency (a stronger desire to urinate) and perhaps even getting up at night more often. This will usually resolve or improve slowly over the healing period. You may see some blood in your urine over the first 6 weeks. Do not be alarmed, even if the urine was clear for a while. Get off your feet and drink lots of fluids until clearing occurs. If you start to pass clots or don't improve call us.  Catheter: (If you are discharged with a catheter.)  1. Keep your catheter secured to your leg at all times with tape or the supplied strap. 2. You may experience leakage of urine around your catheter- as long as the  catheter continues to drain, this is normal.  If your catheter stops draining  go to the ER. 3. You may also have blood in your urine, even after it has been clear for  several days; you may even pass some small blood clots or other material.  This  is normal as well.  If this happens, sit down and drink plenty of water to help  make urine to flush out your bladder.  If the blood in your urine becomes worse  after doing this, contact our office or return to the ER. 4. You may use the leg bag (small bag) during the day, but use the large bag at  night.  Diet:  You may return to your normal diet immediately. Because of the raw surface of your bladder, alcohol, spicy foods, foods high in acid and drinks with caffeine may  cause irritation or frequency and should be used in moderation. To keep your urine flowing freely and avoid constipation, drink plenty of fluids during the day (8-10 glasses). Tip: Avoid cranberry juice because it is very acidic.  Activity:  Your physical activity doesn't need to be restricted. However, if you are very active, you may see some blood in the urine. We suggest that you reduce your activity under the circumstances until the bleeding has stopped.  Bowels:  It is important to keep your bowels regular during the postoperative period. Straining with bowel movements can cause bleeding. A bowel movement every other day is reasonable. Use a mild laxative if needed, such as milk of magnesia 2-3 tablespoons, or 2 Dulcolax tablets. Call if you continue to have problems. If you had been taking narcotics for pain, before, during or after your surgery, you may be constipated. Take a laxative if necessary.    Medication:  You should resume your pre-surgery medications unless told not to. In addition you may be given an antibiotic to prevent or treat infection. Antibiotics are not always necessary. All medication should be taken as prescribed until the bottles are finished unless you are having an unusual reaction to one of the drugs.   Foley Catheter Care A soft, flexible tube (Foley catheter) may have been placed in your bladder to drain urine and fluid. Follow these instructions: Taking  Care of the Catheter Keep the area where the catheter leaves your body clean.  Attach the catheter to the leg so there is no tension on the catheter.  Keep the drainage bag below the level of the bladder, but keep it OFF the floor.  Do not take long soaking baths. Your caregiver will give instructions about showering.  Wash your hands before touching ANYTHING related to the catheter or bag.  Using mild soap and warm water on a washcloth:  Clean the area closest to the catheter insertion site using a  circular motion around the catheter.  Clean the catheter itself by wiping AWAY from the insertion site for several inches down the tube.  NEVER wipe upward as this could sweep bacteria up into the urethra (tube in your body that normally drains the bladder) and cause infection.  Place a small amount of sterile lubricant at the tip of the penis where the catheter is entering.  Taking Care of the Drainage Bags Two drainage bags may be taken home: a large overnight drainage bag, and a smaller leg bag which fits underneath clothing.  It is okay to wear the overnight bag at any time, but NEVER wear the smaller leg bag at night.  Keep the drainage bag well below the level of your bladder. This prevents backflow of urine into the bladder and allows the urine to drain freely.  Anchor the tubing to your leg to prevent pulling or tension on the catheter. Use tape or a leg strap provided by the hospital.  Empty the drainage bag when it is 1/2 to 3/4 full. Wash your hands before and after touching the bag.  Periodically check the tubing for kinks to make sure there is no pressure on the tubing which could restrict the flow of urine.  Changing the Drainage Bags Cleanse both ends of the clean bag with alcohol before changing.  Pinch off the rubber catheter to avoid urine spillage during the disconnection.  Disconnect the dirty bag and connect the clean one.  Empty the dirty bag carefully to avoid a urine spill.  Attach the new bag to the leg with tape or a leg strap.  Cleaning the Drainage Bags Whenever a drainage bag is disconnected, it must be cleaned quickly so it is ready for the next use.  Wash the bag in warm, soapy water.  Rinse the bag thoroughly with warm water.  Soak the bag for 30 minutes in a solution of white vinegar and water (1 cup vinegar to 1 quart warm water).  Rinse with warm water.  SEEK MEDICAL CARE IF:  You have chills or night sweats.  You are leaking around your catheter or have  problems with your catheter. It is not uncommon to have sporadic leakage around your catheter as a result of bladder spasms. If the leakage stops, there is not much need for concern. If you are uncertain, call your caregiver.  You develop side effects that you think are coming from your medicines.  SEEK IMMEDIATE MEDICAL CARE IF:  You are suddenly unable to urinate. Check to see if there are any kinks in the drainage tubing that may cause this. If you cannot find any kinks, call your caregiver immediately. This is an emergency.  You develop shortness of breath or chest pains.  Bleeding persists or clots develop in your urine.  You have a fever.  You develop pain in your back or over your lower belly (abdomen).  You develop pain or swelling  in your legs.  Any problems you are having get worse rather than better.  MAKE SURE YOU:  Understand these instructions.  Will watch your condition.  Will get help right away if you are not doing well or get worse.    Removal of catheter Remove the foley catheter on Friday, 1/13 in the morning.  This can be done easily by cutting the side port of the catheter, which will allow the balloon to deflate.  You will see 1-2 teaspoons of clear water as the balloon deflates and then the catheter can be slid out without difficulty.        Cut here     Post Anesthesia Home Care Instructions  Activity: Get plenty of rest for the remainder of the day. A responsible individual must stay with you for 24 hours following the procedure.  For the next 24 hours, DO NOT: -Drive a car -Paediatric nurse -Drink alcoholic beverages -Take any medication unless instructed by your physician -Make any legal decisions or sign important papers.  Meals: Start with liquid foods such as gelatin or soup. Progress to regular foods as tolerated. Avoid greasy, spicy, heavy foods. If nausea and/or vomiting occur, drink only clear liquids until the nausea and/or vomiting subsides.  Call your physician if vomiting continues.  Special Instructions/Symptoms: Your throat may feel dry or sore from the anesthesia or the breathing tube placed in your throat during surgery. If this causes discomfort, gargle with warm salt water. The discomfort should disappear within 24 hours.

## 2021-09-07 NOTE — Anesthesia Postprocedure Evaluation (Signed)
Anesthesia Post Note  Patient: Chris Burton  Procedure(s) Performed: CYSTOSCOPY WITH URETHRAL BALLOON DILATATION (Bladder)     Patient location during evaluation: PACU Anesthesia Type: General Level of consciousness: awake and alert Pain management: pain level controlled Vital Signs Assessment: post-procedure vital signs reviewed and stable Respiratory status: spontaneous breathing, nonlabored ventilation, respiratory function stable and patient connected to nasal cannula oxygen Cardiovascular status: blood pressure returned to baseline and stable Postop Assessment: no apparent nausea or vomiting Anesthetic complications: no   No notable events documented.  Last Vitals:  Vitals:   09/07/21 1245 09/07/21 1315  BP: 126/83 (!) 149/97  Pulse: 65 65  Resp: 12 12  Temp:    SpO2: 97% 100%    Last Pain:  Vitals:   09/07/21 1315  TempSrc:   PainSc: 0-No pain                 Torin Modica

## 2021-09-07 NOTE — Transfer of Care (Signed)
Immediate Anesthesia Transfer of Care Note  Patient: ABHISHEK LEVESQUE  Procedure(s) Performed: CYSTOSCOPY WITH URETHRAL BALLOON DILATATION (Bladder)  Patient Location: PACU  Anesthesia Type:General  Level of Consciousness: sedated, patient cooperative and responds to stimulation  Airway & Oxygen Therapy: Patient Spontanous Breathing and Patient connected to face mask oxygen  Post-op Assessment: Report given to RN, Post -op Vital signs reviewed and stable and Patient moving all extremities  Post vital signs: Reviewed and stable  Last Vitals:  Vitals Value Taken Time  BP 132/85 09/07/21 1208  Temp    Pulse 65 09/07/21 1212  Resp 5 09/07/21 1212  SpO2 98 % 09/07/21 1212  Vitals shown include unvalidated device data.  Last Pain:  Vitals:   09/07/21 1041  TempSrc: Oral  PainSc: 0-No pain      Patients Stated Pain Goal: 5 (86/28/24 1753)  Complications: No notable events documented.

## 2021-09-08 ENCOUNTER — Encounter (HOSPITAL_BASED_OUTPATIENT_CLINIC_OR_DEPARTMENT_OTHER): Payer: Self-pay | Admitting: Urology

## 2021-11-27 HISTORY — PX: TRANSURETHRAL RESECTION OF PROSTATE: SHX73

## 2022-01-27 DIAGNOSIS — Z8619 Personal history of other infectious and parasitic diseases: Secondary | ICD-10-CM

## 2022-01-27 HISTORY — DX: Personal history of other infectious and parasitic diseases: Z86.19

## 2022-03-12 ENCOUNTER — Encounter (HOSPITAL_COMMUNITY): Payer: Self-pay

## 2022-03-12 ENCOUNTER — Other Ambulatory Visit: Payer: Self-pay

## 2022-03-12 ENCOUNTER — Emergency Department (HOSPITAL_COMMUNITY)
Admission: EM | Admit: 2022-03-12 | Discharge: 2022-03-12 | Disposition: A | Discharge status: Home / Self Care | Payer: Medicare PPO | Attending: Emergency Medicine | Admitting: Emergency Medicine

## 2022-03-12 DIAGNOSIS — N390 Urinary tract infection, site not specified: Secondary | ICD-10-CM

## 2022-03-12 DIAGNOSIS — R339 Retention of urine, unspecified: Secondary | ICD-10-CM | POA: Diagnosis present

## 2022-03-12 DIAGNOSIS — N183 Chronic kidney disease, stage 3 unspecified: Secondary | ICD-10-CM | POA: Diagnosis not present

## 2022-03-12 DIAGNOSIS — Z87898 Personal history of other specified conditions: Secondary | ICD-10-CM

## 2022-03-12 DIAGNOSIS — Z8546 Personal history of malignant neoplasm of prostate: Secondary | ICD-10-CM | POA: Diagnosis not present

## 2022-03-12 HISTORY — DX: Personal history of other specified conditions: Z87.898

## 2022-03-12 LAB — CBC WITH DIFFERENTIAL/PLATELET
Abs Immature Granulocytes: 0.03 10*3/uL (ref 0.00–0.07)
Basophils Absolute: 0 10*3/uL (ref 0.0–0.1)
Basophils Relative: 0 %
Eosinophils Absolute: 0.1 10*3/uL (ref 0.0–0.5)
Eosinophils Relative: 2 %
HCT: 43 % (ref 39.0–52.0)
Hemoglobin: 14.8 g/dL (ref 13.0–17.0)
Immature Granulocytes: 1 %
Lymphocytes Relative: 29 %
Lymphs Abs: 1.4 10*3/uL (ref 0.7–4.0)
MCH: 32 pg (ref 26.0–34.0)
MCHC: 34.4 g/dL (ref 30.0–36.0)
MCV: 92.9 fL (ref 80.0–100.0)
Monocytes Absolute: 0.6 10*3/uL (ref 0.1–1.0)
Monocytes Relative: 12 %
Neutro Abs: 2.6 10*3/uL (ref 1.7–7.7)
Neutrophils Relative %: 56 %
Platelets: 139 10*3/uL — ABNORMAL LOW (ref 150–400)
RBC: 4.63 MIL/uL (ref 4.22–5.81)
RDW: 13.5 % (ref 11.5–15.5)
WBC: 4.8 10*3/uL (ref 4.0–10.5)
nRBC: 0 % (ref 0.0–0.2)

## 2022-03-12 LAB — URINALYSIS, ROUTINE W REFLEX MICROSCOPIC
Bacteria, UA: NONE SEEN
Bilirubin Urine: NEGATIVE
Glucose, UA: NEGATIVE mg/dL
Ketones, ur: NEGATIVE mg/dL
Nitrite: NEGATIVE
Protein, ur: 30 mg/dL — AB
Specific Gravity, Urine: 1.016 (ref 1.005–1.030)
WBC, UA: >50 WBC/hpf — ABNORMAL HIGH (ref 0–5)
pH: 5 (ref 5.0–8.0)

## 2022-03-12 LAB — BASIC METABOLIC PANEL
Anion gap: 5 (ref 5–15)
BUN: 24 mg/dL — ABNORMAL HIGH (ref 8–23)
CO2: 23 mmol/L (ref 22–32)
Calcium: 9.2 mg/dL (ref 8.9–10.3)
Chloride: 111 mmol/L (ref 98–111)
Creatinine, Ser: 1.7 mg/dL — ABNORMAL HIGH (ref 0.61–1.24)
GFR, Estimated: 40 mL/min — ABNORMAL LOW (ref >60)
Glucose, Bld: 95 mg/dL (ref 70–99)
Potassium: 4.7 mmol/L (ref 3.5–5.1)
Sodium: 139 mmol/L (ref 135–145)

## 2022-03-12 NOTE — Consult Note (Signed)
Reason for Consult: Recurrent Urinary Retention / Stricture  Referring Physician:  Lacretia Leigh MD  Chris Burton is an 81 y.o. male.   HPI:   1 - Urinary Retention / Urethral Stricture - new urinary retention after prodrom of incontincne x few mos 2017. 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 H/o brachytherapy around 1999.   Recent Course: 12/2020 - operative balloon dilation by Eskridge 08/2021 - operative balloon dilation by Pace 11/2021 - "TURP" done in [place], incontinent after 02/2022 - partial distal prostate/membranous area stricture recurrence  2 -  Prostate Cancer - Gleason 6 disease DX'd 1999 treated in [place] with Brachytherapy. He states PSA low and he gets checks there annually, no data for review.    PMH sig for renal insuficiency (Cr 2's follows nephrology in [place]).   Today " Chris Burton " is seen as urgent consult for suspect recurrent stricture. Prodrome of weakenign stream. Is still emptying. ER NSG unable to place catheter. He has a procedure doen in [place] few mos ago that left him initially incontinent, and now back in retention.  Past Medical History:  Diagnosis Date   Anxiety    Arthritis    Cancer [facility])    prostate   Chronic kidney disease    Dr Felicity Pellegrini, [facility]. Reduce kidney funtion   Depression    GERD (gastroesophageal reflux disease)    Gout    Hepatitis A    PMH   Hx of adenomatous colonic polyps    Dr Olevia Perches   Hyperlipidemia    LDL goal = < 100   Skin cancer    Basal Cell, Dr Sherrye Payor   Sleep apnea     Past Surgical History:  Procedure Laterality Date   COLONOSCOPY W/ POLYPECTOMY  08/29/2002   Dr Olevia Perches   CYSTOSCOPY  08/29/2008   to remove kidney stone   CYSTOSCOPY N/A 01/26/2021   Procedure: CYSTOSCOPY FLEXIBLE WITH BALLOON DILATION FOR URETHRAL STRICTURE;  Surgeon: Robley Fries, MD;  Location: [facility];  Service: Urology;  Laterality: N/A;   CYSTOSCOPY WITH URETHRAL DILATATION N/A 09/07/2021    Procedure: CYSTOSCOPY WITH URETHRAL BALLOON DILATATION;  Surgeon: Robley Fries, MD;  Location: [facility];  Service: Urology;  Laterality: N/A;   ESOPHAGEAL DILATION  08/29/2005   Dr Olevia Perches   EXTERNAL BEAM RADIATION     & SEED IMPLANTS FOR RPOSTATE CANCER   IR GENERIC HISTORICAL  06/17/2016   IR US GUIDE VASC ACCESS RIGHT 06/17/2016 [facility]   IR GENERIC HISTORICAL  06/17/2016   IR FLUORO GUIDE CV LINE RIGHT 06/17/2016 [facility]   IR GENERIC HISTORICAL  06/30/2016   IR REMOVAL TUN CV CATH W/O FL 06/30/2016 Greggory Keen, MD [facility]   TONSILLECTOMY     TRANSURETHRAL RESECTION OF PROSTATE     XI ROBOTIC ASSISTED INGUINAL HERNIA REPAIR WITH MESH Bilateral 01/26/2021   Procedure: XI ROBOTIC ASSISTED BILATERAL INGUINAL HERNIA REPAIR;  Surgeon: Johnathan Hausen, MD;  Location: [facility];  Service: General;  Laterality: Bilateral;    Family History  Problem Relation Age of Onset   Lung cancer Mother        SMOKER   Prostate cancer Father    Stomach cancer Sister    Leukemia Paternal Grandmother    Prostate cancer Paternal Grandfather    Diabetes Maternal Grandmother    Heart disease Neg Hx    Kidney disease Neg Hx    Stroke Neg Hx    Colon  cancer Neg Hx     Social History:  reports that he quit smoking about 61 years ago. His smoking use included cigarettes. He has never used smokeless tobacco. He reports that he does not drink alcohol and does not use drugs.  Allergies:  Allergies  Allergen Reactions   Banana Other (See Comments)    Throat tightening   Methylparaben Other (See Comments)    Positive on skin test    Medications: I have reviewed the patient's current medications.  Results for orders placed or performed during the hospital encounter of 03/12/22 (from the past 48 hour(s))  Urinalysis, Routine w reflex microscopic     Status: Abnormal   Collection Time: 03/12/22 10:07 AM  Result Value Ref Range   Color, Urine AMBER (A) YELLOW     Comment: BIOCHEMICALS MAY BE AFFECTED BY COLOR   APPearance CLOUDY (A) CLEAR   Specific Gravity, Urine 1.016 1.005 - 1.030   pH 5.0 5.0 - 8.0   Glucose, UA NEGATIVE NEGATIVE mg/dL   Hgb urine dipstick SMALL (A) NEGATIVE   Bilirubin Urine NEGATIVE NEGATIVE   Ketones, ur NEGATIVE NEGATIVE mg/dL   Protein, ur 30 (A) NEGATIVE mg/dL   Nitrite NEGATIVE NEGATIVE   Leukocytes,Ua LARGE (A) NEGATIVE   RBC / HPF 21-50 0 - 5 RBC/hpf   WBC, UA >50 (H) 0 - 5 WBC/hpf   Bacteria, UA NONE SEEN NONE SEEN   Squamous Epithelial / LPF 0-5 0 - 5   WBC Clumps PRESENT    Mucus PRESENT     Comment: Performed at [facility], [place] [address], [place], [place] [address]  CBC with Differential     Status: Abnormal   Collection Time: 03/12/22 11:05 AM  Result Value Ref Range   WBC 4.8 4.0 - 10.5 K/uL   RBC 4.63 4.22 - 5.81 MIL/uL   Hemoglobin 14.8 13.0 - 17.0 g/dL   HCT 43.0 39.0 - 52.0 %   MCV 92.9 80.0 - 100.0 fL   MCH 32.0 26.0 - 34.0 pg   MCHC 34.4 30.0 - 36.0 g/dL   RDW 13.5 11.5 - 15.5 %   Platelets 139 (L) 150 - 400 K/uL   nRBC 0.0 0.0 - 0.2 %   Neutrophils Relative % 56 %   Neutro Abs 2.6 1.7 - 7.7 K/uL   Lymphocytes Relative 29 %   Lymphs Abs 1.4 0.7 - 4.0 K/uL   Monocytes Relative 12 %   Monocytes Absolute 0.6 0.1 - 1.0 K/uL   Eosinophils Relative 2 %   Eosinophils Absolute 0.1 0.0 - 0.5 K/uL   Basophils Relative 0 %   Basophils Absolute 0.0 0.0 - 0.1 K/uL   Immature Granulocytes 1 %   Abs Immature Granulocytes 0.03 0.00 - 0.07 K/uL    Comment: Performed at [facility], [place] [address], [place], [place] [address]  Basic metabolic panel     Status: Abnormal   Collection Time: 03/12/22 11:28 AM  Result Value Ref Range   Sodium 139 135 - 145 mmol/L   Potassium 4.7 3.5 - 5.1 mmol/L   Chloride 111 98 - 111 mmol/L   CO2 23 22 - 32 mmol/L   Glucose, Bld 95 70 - 99 mg/dL    Comment: Glucose reference range applies only to samples taken after fasting for  at least 8 hours.   BUN 24 (H) 8 - 23 mg/dL   Creatinine, Ser 1.70 (H) 0.61 - 1.24 mg/dL   Calcium 9.2 8.9 -  10.3 mg/dL   GFR, Estimated 40 (L) >60 mL/min    Comment: (NOTE) Calculated using the CKD-EPI Creatinine Equation (2021)    Anion gap 5 5 - 15    Comment: Performed at [facility], [place] [address], [place], [place] [address]    No results found.  Review of Systems  Constitutional:  Negative for chills and fever.  Genitourinary:  Positive for decreased urine volume, difficulty urinating, frequency and urgency. Negative for flank pain.  All other systems reviewed and are negative.  Blood pressure 124/87, pulse 75, temperature 97.9 F (36.6 C), temperature source Oral, resp. rate 16, height 5' 10.5" (1.791 m), weight 81.2 kg, SpO2 100 %. Physical Exam Vitals reviewed.  Constitutional:      Comments: Wife at bedside. Both pleasant.   HENT:     Nose: Nose normal.  Eyes:     Pupils: Pupils are equal, round, and reactive to light.  Cardiovascular:     Rate and Rhythm: Normal rate.  Pulmonary:     Effort: Pulmonary effort is normal.  Abdominal:     General: Abdomen is flat.  Genitourinary:    Comments: Some dribbling per penis into adult diaper.  Musculoskeletal:        General: Normal range of motion.     Cervical back: Normal range of motion.  Neurological:     General: No focal deficit present.     Mental Status: He is alert.  Psychiatric:        Mood and Affect: Mood normal.    BEDSIDE CYSTO / DILATION: Using aseptic technique cystourethroscopy perforemd with 39F flexibel scope after viscous lidocaine. Anterior urethra unremarkable. Some tortuosity and scarrified / narrowed tissue beginning at membranous urethra. Just barely abel to navigate to bladder which is full. Marland Kitchen038 sensor wire place over which 66F balloon dilation aparatus advanced, inlfated to 20atm x 90seconds and released with immediate efflux of copious urine per penis. 30F council  catheter placed over into bladder with additional 400cc urine output (estimate abtou 600cc total). Immediate pain relieve. No hematuria. Connected to bedside bag.   Assessment/Plan:  1 - Urinary Retention / Urethral Stricture - unfortunate and difficult to manage sequale of prior radiation. Temporized again today with dilation / catheter. Has OV with [place] next week and catheter can be removed then.  His dilation intervals are becoming quite short. Would storngly consider elective OR eval with repeat dilation and dilute Mito-C injection into stricture to max lower risk of recurrence. If remains problematic despite that, then daily self calibration or even SPT may be reasonable.   2 -  Prostate Cancer - met critera for cure years ago per patient.  OK for DC home today and fu Urol as schedueld next week.   Alexis Frock 03/12/2022, 2:26 PM

## 2022-03-12 NOTE — Discharge Instructions (Signed)
You came to the emergency department today to be evaluated for your urinary retention.  A urine catheter was placed by the urologist Dr. Tammi Klippel.  Please follow-up with your urologist next week for further evaluation.  Your urine sample was concerning for a urinary tract infection, please continue to take your levofloxacin antibiotic medication as prescribed.  Get help right away if: You have chills or a fever. You have blood in your urine. You have a catheter and the following happens: Your catheter stops draining urine. Your catheter falls out.

## 2022-03-12 NOTE — ED Provider Notes (Signed)
[facility] DEPT Provider Note   CSN: [id number] Arrival date & time: 03/12/22  0944     History  Chief Complaint  Patient presents with   Urinary Retention    YADRIEL KERRIGAN is a 81 y.o. male With a history of prostate cancer status post brachytherapy over 20 years ago.  Patient was found to have urethral stricture 08/2021 and had a cystoscopy with urethral balloon dilation performed on 09/07/2021 performed by Dr. Claudia Desanctis.  Patient has medical history of stage III CKD, urinary incontinence on Myrbetriq 50 mg.  Patient reports that he went to a urologist in [place] and had a TURP procedure in April.  Patient states that his urologist said there was "hardening of his prostate and migration of his radioactive seeds."  Patient states that since this surgery he has been dealing with urinary incontinence and has been taking Myrbetriq with minimal improvement in his symptoms.  Patient states that over that starting yesterday he had difficulty urinating.  Patient states that yesterday he started having the feeling of needing to urinate which is unusual for him.  Patient states that he had 1 episode of hematuria yesterday.  Patient reports that this morning he woke up with the sensation of needing to urinate and was unable to.  Patient states that he slowly over a 20-minute period was able to empty his bladder.  Patient reports that he does have follow-up with his urologist Dr. Claudia Desanctis in the coming weeks.  Patient reports that he saw his PCP recently and is currently being treated with levofloxacin for urinary tract infection.  Patient states that he started this medication yesterday.  Patient also reports that he is taking Flagyl for a course of colitis.    HPI     Home Medications Prior to Admission medications   Medication Sig Start Date End Date Taking? Authorizing Provider  betamethasone dipropionate 0.05 % cream Apply 1 application topically 2 (two)  times daily as needed (irritation).    [provider]  cholecalciferol (VITAMIN D3) 25 MCG (1000 UNIT) tablet Take 1,000 Units by mouth daily.    [provider]  ciclopirox (PENLAC) 8 % solution Apply 1 application topically at bedtime as needed (nail fungus). Apply over nail and surrounding skin. Apply daily over previous coat. After seven (7) days, may remove with alcohol and continue cycle.    [provider]  esomeprazole (NEXIUM) 40 MG capsule Take 40 mg by mouth 2 (two) times daily before a meal.    [provider]  fluticasone (CUTIVATE) 0.05 % cream Apply 1 application topically 2 (two) times daily as needed (irritation).    [provider]  HYDROcodone-acetaminophen (NORCO/VICODIN) 5-325 MG tablet Take 1-2 tablets by mouth every 4 (four) hours as needed for moderate pain. 01/27/21   Johnathan Hausen, MD  Magnesium Cl-Calcium Carbonate (SLOW-MAG PO) Take 1 tablet by mouth daily.    [provider]  Misc Natural Products (BRAINSTRONG MEMORY SUPPORT PO) Take 1 capsule by mouth in the morning and at bedtime.    [provider]  Misc Natural Products (GLUCOSAMINE CHOND MSM FORMULA PO) Take 1 tablet by mouth in the morning and at bedtime.    [provider]  probenecid (BENEMID) 500 MG tablet TAKE 1 TABLET TWICE A DAY Patient taking differently: Take 500 mg by mouth 2 (two) times daily. TAKE 1 TABLET TWICE A DAY 07/16/13   Hendricks Limes, MD  Psyllium (FIBER) 0.52 G CAPS Take 2 capsules  by mouth in the morning and at bedtime.    [provider]  tretinoin (RETIN-A) 0.05 % cream Apply 1 application topically at bedtime.    [provider]  valACYclovir (VALTREX) 500 MG tablet Take 1 tablet (500 mg total) by mouth 2 (two) times daily. Patient taking differently: Take 500 mg by mouth daily as needed (outbreaks). 06/17/16   Bonnielee Haff, MD      Allergies    Banana and Methylparaben    Review of Systems    Review of Systems  Constitutional:  Negative for chills and fever.  Respiratory:  Negative for shortness of breath.   Cardiovascular:  Negative for chest pain.  Gastrointestinal:  Negative for abdominal pain, nausea and vomiting.  Genitourinary:  Positive for difficulty urinating and hematuria. Negative for dysuria, flank pain, frequency, genital sores, penile discharge, penile pain, penile swelling, scrotal swelling, testicular pain and urgency.  Musculoskeletal:  Negative for back pain and neck pain.  Skin:  Negative for color change and rash.  Neurological:  Negative for dizziness, syncope, light-headedness and headaches.  Psychiatric/Behavioral:  Negative for confusion.     Physical Exam Updated Vital Signs BP 132/80 (BP Location: Right Arm)   Pulse 71   Temp 97.9 F (36.6 C) (Oral)   Resp 16   Ht 5' 10.5" (1.791 m)   Wt 81.2 kg   SpO2 100%   BMI 25.32 kg/m  Physical Exam Vitals and nursing note reviewed.  Constitutional:      General: He is not in acute distress.    Appearance: He is not ill-appearing, toxic-appearing or diaphoretic.  HENT:     Head: Normocephalic.  Eyes:     General: No scleral icterus.       Right eye: No discharge.        Left eye: No discharge.  Cardiovascular:     Rate and Rhythm: Normal rate.  Pulmonary:     Effort: Pulmonary effort is normal.  Abdominal:     General: Abdomen is flat. Bowel sounds are normal. There is no distension. There are no signs of injury.     Palpations: Abdomen is soft. There is no mass or pulsatile mass.     Tenderness: There is no abdominal tenderness. There is no right CVA tenderness, left CVA tenderness, guarding or rebound.     Hernia: There is no hernia in the umbilical area or ventral area.  Skin:    General: Skin is warm and dry.  Neurological:     General: No focal deficit present.     Mental Status: He is alert.  Psychiatric:        Behavior: Behavior is cooperative.     ED Results / Procedures /  Treatments   Labs (all labs ordered are listed, but only abnormal results are displayed) Labs Reviewed  URINALYSIS, ROUTINE W REFLEX MICROSCOPIC - Abnormal; Notable for the following components:      Result Value   Color, Urine AMBER (*)    APPearance CLOUDY (*)    Hgb urine dipstick SMALL (*)    Protein, ur 30 (*)    Leukocytes,Ua LARGE (*)    WBC, UA >50 (*)    All other components within normal limits  CBC WITH DIFFERENTIAL/PLATELET - Abnormal; Notable for the following components:   Platelets 139 (*)    All other components within normal limits  BASIC METABOLIC PANEL - Abnormal; Notable for the following components:   BUN 24 (*)    Creatinine, Ser 1.70 (*)  GFR, Estimated 40 (*)    All other components within normal limits  URINE CULTURE    EKG None  Radiology No results found.  Procedures Procedures    Medications Ordered in ED Medications - No data to display  ED Course/ Medical Decision Making/ A&P Clinical Course as of 03/12/22 1743  Sat Mar 12, 2022  1301 I was contacted by RN who attempted Foley catheter insertion x2 using coud Foley catheter with unsuccessful attempts.  We will reach out to urology due to urinary retention and unable to place catheter for [PB]  1327 I spoke to Dr. Tresa Moore with urology who advised that he will come to see the patient in the emergency department.  Requested urology cart moved to the bedside. [PB]    Clinical Course User Index [PB] Loni Beckwith, PA-C                           Medical Decision Making Amount and/or Complexity of Data Reviewed Labs: ordered.   Alert 81 year old male in no acute distress, nontoxic-appearing.  Presents emergency department for complaint of hematuria and urinary retention.  Information was obtained from patient.  I reviewed patient's past medical records including previous prior notes, labs, and imaging.  Patient has medical history as outlined in HPI which complicates care.  With  reports of urinary retention bladder scan was performed 3 times with no urine found in his bladder each time.  Will obtain urinalysis, BMP, and CBC.  Plan to place Foley catheter due to reports of urinary retention.  Foley catheter placement was unsuccessful x2 using coud catheter.  I contacted on-call urologist and spoke with Dr. Tresa Moore who will see the patient in the emergency department.  I personally viewed and interpreted patient's lab results.  Pertinent findings include: -Creatinine 1.7, appears baseline for patient -Urinalysis has large amount of white blood cells as well as leukocytes.  No bacteria seen.  Concern for possible infection, will have patient continue his levofloxacin antibiotic. -CBC unremarkable  Dr. Tresa Moore evaluated the patient in the emergency department and placed a Foley catheter.  Patient to follow-up with his urologist Dr. Claudia Desanctis in the outpatient setting.  Patient was discussed with and evaluated by Dr. Zenia Resides.  Based on patient's chief complaint, I considered admission might be necessary, however after reassuring ED workup feel patient is reasonable for discharge.  Discussed results, findings, treatment and follow up. Patient advised of return precautions. Patient verbalized understanding and agreed with plan.  Portions of this note were generated with Lobby[employer]. Dictation errors may occur despite best attempts at proofreading.          Final Clinical Impression(s) / ED Diagnoses Final diagnoses:  Urinary retention  Urinary tract infection without hematuria, site unspecified    Rx / DC Orders ED Discharge Orders     None         Dyann Ruddle 03/12/22 1743    Lacretia Leigh, MD 03/14/22 1438

## 2022-03-12 NOTE — ED Notes (Signed)
Urology cart to bedside per provider request

## 2022-03-12 NOTE — ED Notes (Signed)
Bladder scanned 3x, measured 0 mL each time. Pt states he does not have the "urge to urinate" at this time. States the last time he urinated was 8:30am today.  Urinal at bedside

## 2022-03-12 NOTE — ED Notes (Signed)
Foley catheter in place by urologist patent and draining. Placed on leg bag. No complaints. No signs of distress.

## 2022-03-12 NOTE — ED Provider Notes (Signed)
I provided a substantive portion of the care of this patient.  I personally performed the entirety of the medical decision making for this encounter.   This 81 year old male presents with urinary frequency.  Patient's urinalysis is infected here.  Nursing attempted to place Foley catheter unsuccessfully.  Urology has been consulted for urinary catheter placement.  Patient does not appear septic at this time.  Will be discharged home to continue his course of antibiotics which is started for no UTI yesterday.      Lacretia Leigh, MD 03/12/22 1410

## 2022-03-12 NOTE — ED Triage Notes (Addendum)
Patient states he had a TURP in [place] in April  2023. Patient states he has been taking Flagyl for an intestinal infection from a bad watermelon while in [place]. Patient also has a UTI and started an antibiotic for that yesterday.  Patient states he has been incontinent of urine since the surgery. Patient was taking a medicine to help his incontinence which helped.  Patient states yesterday AM he had blood in his urine and the second voiding of the day he had a weak stream and no blood present.  Patient states this AM he felt like he needed to urinate and only had dribbles.

## 2022-03-13 LAB — URINE CULTURE: Culture: NO GROWTH

## 2022-03-22 ENCOUNTER — Other Ambulatory Visit: Payer: Self-pay | Admitting: Urology

## 2022-04-05 ENCOUNTER — Encounter (HOSPITAL_BASED_OUTPATIENT_CLINIC_OR_DEPARTMENT_OTHER): Payer: Self-pay | Admitting: Urology

## 2022-04-05 NOTE — Progress Notes (Addendum)
Addendum:  received call from pt today stated per his GI doctor to start taking pepto-bismol liquid 2 capful every hour starting today for gas/ diarrhea.  Pt inquiring if he can do this up until start of his surgery tomorrow.  Pt verbalized understanding he can do this up until midnight tonight, none after midnight , but can take imodium pill if needed morning of surgery.   Spoke w/ via phone for pre-op interview--- pt Lab needs dos----  no             Lab results------ no COVID test -----patient states asymptomatic no test needed Arrive at ------- 1130 on 04-12-2022 NPO after MN NO Solid Food.  Clear liquids from MN until--- 1030 Med rec completed Medications to take morning of surgery ----- nexium, probenecid Diabetic medication ----- n/a Patient instructed no nail polish to be worn day of surgery Patient instructed to bring photo id and insurance card day of surgery Patient aware to have Driver (ride ) / caregiver  for 24 hours after surgery --wife, amelia Patient Special Instructions ----- n/a Pre-Op special Istructions ----- n/a Patient verbalized understanding of instructions that were given at this phone interview. Patient denies shortness of breath, chest pain, fever, cough at this phone interview.

## 2022-04-11 NOTE — Anesthesia Preprocedure Evaluation (Signed)
Anesthesia Evaluation  Patient identified by MRN, date of birth, ID band  Reviewed: Allergy & Precautions, NPO status , Patient's Chart, lab work & pertinent test results  Airway Mallampati: II  TM Distance: >3 FB Neck ROM: Full    Dental no notable dental hx. (+) Partial Upper, Dental Advisory Given,    Pulmonary sleep apnea , former smoker,    Pulmonary exam normal breath sounds clear to auscultation       Cardiovascular Exercise Tolerance: Good Normal cardiovascular exam Rhythm:Regular Rate:Normal     Neuro/Psych PSYCHIATRIC DISORDERS Anxiety Depression    GI/Hepatic Neg liver ROS, GERD  ,  Endo/Other  negative endocrine ROS  Renal/GU Renal disease   Prostate ca    Musculoskeletal  (+) Arthritis ,   Abdominal   Peds  Hematology Lab Results      Component                Value               Date                      WBC                      4.8                 03/12/2022                HGB                      14.8                03/12/2022                HCT                      43.0                03/12/2022                MCV                      92.9                03/12/2022                PLT                      139 (L)             03/12/2022              Anesthesia Other Findings All: allopurionol, methylparaben  Reproductive/Obstetrics                            Anesthesia Physical Anesthesia Plan  ASA: 3  Anesthesia Plan: General   Post-op Pain Management: Minimal or no pain anticipated   Induction: Intravenous  PONV Risk Score and Plan: 3 and Treatment may vary due to age or medical condition and Ondansetron  Airway Management Planned: LMA  Additional Equipment: None  Intra-op Plan:   Post-operative Plan:   Informed Consent: I have reviewed the patients History and Physical, chart, labs and discussed the procedure including the risks, benefits and alternatives for  the proposed anesthesia with the patient or authorized representative who has indicated his/her understanding  and acceptance.     Dental advisory given  Plan Discussed with:   Anesthesia Plan Comments:        Anesthesia Quick Evaluation

## 2022-04-12 ENCOUNTER — Other Ambulatory Visit: Payer: Self-pay

## 2022-04-12 ENCOUNTER — Encounter (HOSPITAL_BASED_OUTPATIENT_CLINIC_OR_DEPARTMENT_OTHER)
Admission: RE | Disposition: A | Discharge status: Home / Self Care | Payer: Self-pay | Source: Home / Self Care | Attending: Urology

## 2022-04-12 ENCOUNTER — Encounter (HOSPITAL_BASED_OUTPATIENT_CLINIC_OR_DEPARTMENT_OTHER): Payer: Self-pay | Admitting: Urology

## 2022-04-12 ENCOUNTER — Ambulatory Visit (HOSPITAL_BASED_OUTPATIENT_CLINIC_OR_DEPARTMENT_OTHER)
Admission: RE | Admit: 2022-04-12 | Discharge: 2022-04-12 | Disposition: A | Discharge status: Home / Self Care | Payer: Medicare PPO | Attending: Urology | Admitting: Urology

## 2022-04-12 ENCOUNTER — Ambulatory Visit (HOSPITAL_BASED_OUTPATIENT_CLINIC_OR_DEPARTMENT_OTHER): Payer: Medicare PPO | Admitting: Anesthesiology

## 2022-04-12 DIAGNOSIS — Z87891 Personal history of nicotine dependence: Secondary | ICD-10-CM | POA: Insufficient documentation

## 2022-04-12 DIAGNOSIS — N393 Stress incontinence (female) (male): Secondary | ICD-10-CM | POA: Insufficient documentation

## 2022-04-12 DIAGNOSIS — G4730 Sleep apnea, unspecified: Secondary | ICD-10-CM | POA: Insufficient documentation

## 2022-04-12 DIAGNOSIS — N4289 Other specified disorders of prostate: Secondary | ICD-10-CM | POA: Diagnosis not present

## 2022-04-12 DIAGNOSIS — Z8546 Personal history of malignant neoplasm of prostate: Secondary | ICD-10-CM | POA: Diagnosis not present

## 2022-04-12 DIAGNOSIS — M1990 Unspecified osteoarthritis, unspecified site: Secondary | ICD-10-CM | POA: Diagnosis not present

## 2022-04-12 DIAGNOSIS — F418 Other specified anxiety disorders: Secondary | ICD-10-CM | POA: Diagnosis not present

## 2022-04-12 DIAGNOSIS — K219 Gastro-esophageal reflux disease without esophagitis: Secondary | ICD-10-CM | POA: Insufficient documentation

## 2022-04-12 DIAGNOSIS — N35919 Unspecified urethral stricture, male, unspecified site: Secondary | ICD-10-CM | POA: Insufficient documentation

## 2022-04-12 DIAGNOSIS — Z01818 Encounter for other preprocedural examination: Secondary | ICD-10-CM

## 2022-04-12 HISTORY — PX: CYSTOSCOPY WITH INJECTION: SHX1424

## 2022-04-12 HISTORY — DX: Personal history of urinary calculi: Z87.442

## 2022-04-12 HISTORY — DX: Personal history of other malignant neoplasm of skin: Z85.828

## 2022-04-12 HISTORY — DX: Personal history of other infectious and parasitic diseases: Z86.19

## 2022-04-12 HISTORY — DX: Unspecified urethral stricture, male, unspecified site: N35.919

## 2022-04-12 HISTORY — DX: Chronic gout, unspecified, without tophus (tophi): M1A.9XX0

## 2022-04-12 HISTORY — DX: Obstructive sleep apnea (adult) (pediatric): G47.33

## 2022-04-12 HISTORY — DX: Male erectile dysfunction, unspecified: N52.9

## 2022-04-12 HISTORY — DX: Chronic kidney disease, stage 3 unspecified: N18.30

## 2022-04-12 HISTORY — DX: Presence of dental prosthetic device (complete) (partial): Z97.2

## 2022-04-12 HISTORY — DX: Personal history of other malignant neoplasm of skin: Z98.890

## 2022-04-12 HISTORY — DX: Diaphragmatic hernia without obstruction or gangrene: K44.9

## 2022-04-12 HISTORY — DX: Mixed hyperlipidemia: E78.2

## 2022-04-12 SURGERY — CYSTOSCOPY, WITH INJECTION OF BLADDER NECK OR BLADDER WALL
Anesthesia: General | Site: Urethra

## 2022-04-12 MED ORDER — FENTANYL CITRATE (PF) 100 MCG/2ML IJ SOLN
INTRAMUSCULAR | Status: AC
  Filled 2022-04-12: qty 2

## 2022-04-12 MED ORDER — LIDOCAINE HCL (CARDIAC) PF 100 MG/5ML IV SOSY
PREFILLED_SYRINGE | INTRAVENOUS | Status: DC | PRN
  Administered 2022-04-12: 50 mg via INTRAVENOUS

## 2022-04-12 MED ORDER — CEFAZOLIN SODIUM-DEXTROSE 2-4 GM/100ML-% IV SOLN
INTRAVENOUS | Status: AC
  Filled 2022-04-12: qty 100

## 2022-04-12 MED ORDER — ONDANSETRON HCL 4 MG/2ML IJ SOLN
INTRAMUSCULAR | Status: DC | PRN
  Administered 2022-04-12: 4 mg via INTRAVENOUS

## 2022-04-12 MED ORDER — PROPOFOL 10 MG/ML IV BOLUS
INTRAVENOUS | Status: DC | PRN
  Administered 2022-04-12: 140 mg via INTRAVENOUS

## 2022-04-12 MED ORDER — SODIUM CHLORIDE 0.9 % IV SOLN: INTRAVENOUS | Status: DC

## 2022-04-12 MED ORDER — EPHEDRINE SULFATE (PRESSORS) 50 MG/ML IJ SOLN
INTRAMUSCULAR | Status: DC | PRN
  Administered 2022-04-12: 10 mg via INTRAVENOUS

## 2022-04-12 MED ORDER — SODIUM CHLORIDE 0.9 % IR SOLN
Status: DC | PRN
  Administered 2022-04-12: 3000 mL

## 2022-04-12 MED ORDER — MITOMYCIN-C INJECTION USE IN OR ONLY (0.4 MG/ML)
3.0000 mL | Freq: Once | INTRAVENOUS | Status: AC
  Administered 2022-04-12: 3 mL
  Filled 2022-04-12: qty 3

## 2022-04-12 MED ORDER — DEXAMETHASONE SODIUM PHOSPHATE 4 MG/ML IJ SOLN
INTRAMUSCULAR | Status: DC | PRN
  Administered 2022-04-12: 4 mg via INTRAVENOUS

## 2022-04-12 MED ORDER — FENTANYL CITRATE (PF) 100 MCG/2ML IJ SOLN
INTRAMUSCULAR | Status: DC | PRN
  Administered 2022-04-12: 25 ug via INTRAVENOUS

## 2022-04-12 MED ORDER — CEFAZOLIN SODIUM-DEXTROSE 2-4 GM/100ML-% IV SOLN
2.0000 g | INTRAVENOUS | Status: AC
  Administered 2022-04-12: 2 g via INTRAVENOUS

## 2022-04-12 MED ORDER — PROPOFOL 500 MG/50ML IV EMUL
INTRAVENOUS | Status: AC
  Filled 2022-04-12: qty 50

## 2022-04-12 MED ORDER — PHENYLEPHRINE HCL (PRESSORS) 10 MG/ML IV SOLN
INTRAVENOUS | Status: DC | PRN
  Administered 2022-04-12: 40 ug via INTRAVENOUS

## 2022-04-12 SURGICAL SUPPLY — 18 items
BAG DRAIN URO-CYSTO SKYTR STRL (DRAIN) ×4 IMPLANT
BAG DRN UROCATH (DRAIN) ×2
CATH FOLEY 2WAY SLVR  5CC 16FR (CATHETERS) ×3
CATH FOLEY 2WAY SLVR 5CC 16FR (CATHETERS) ×1 IMPLANT
CLOTH BEACON ORANGE TIMEOUT ST (SAFETY) ×4 IMPLANT
GLOVE BIO SURGEON STRL SZ 6.5 (GLOVE) ×3 IMPLANT
GLOVE BIOGEL PI IND STRL 7.0 (GLOVE) ×2 IMPLANT
GLOVE BIOGEL PI INDICATOR 7.0 (GLOVE) ×2
GOWN STRL REUS W/TWL LRG LVL3 (GOWN DISPOSABLE) ×6 IMPLANT
IV NS IRRIG 3000ML ARTHROMATIC (IV SOLUTION) ×2 IMPLANT
KIT TURNOVER CYSTO (KITS) ×4 IMPLANT
MANIFOLD NEPTUNE II (INSTRUMENTS) ×3 IMPLANT
NDL ASPIRATION 22 (NEEDLE) ×1 IMPLANT
NEEDLE ASPIRATION 22 (NEEDLE) ×3 IMPLANT
PACK CYSTO (CUSTOM PROCEDURE TRAY) ×3 IMPLANT
SYR 20ML LL LF (SYRINGE) ×3 IMPLANT
SYR CONTROL 10ML LL (SYRINGE) ×3 IMPLANT
TUBE CONNECTING 12X1/4 (SUCTIONS) ×4 IMPLANT

## 2022-04-12 NOTE — Anesthesia Postprocedure Evaluation (Signed)
Anesthesia Post Note  Patient: Chris Burton  Procedure(s) Performed: CYSTOSCOPY WITH INJECTION OF MITOMYCIN (Urethra)     Patient location during evaluation: PACU Anesthesia Type: General Level of consciousness: awake and alert Pain management: pain level controlled Vital Signs Assessment: post-procedure vital signs reviewed and stable Respiratory status: spontaneous breathing, nonlabored ventilation, respiratory function stable and patient connected to nasal cannula oxygen Cardiovascular status: blood pressure returned to baseline and stable Postop Assessment: no apparent nausea or vomiting Anesthetic complications: no   No notable events documented.  Last Vitals:  Vitals:   04/12/22 1429 04/12/22 1436  BP:    Pulse:  74  Resp: 14 14  Temp:  36.4 C  SpO2:  97%    Last Pain:  Vitals:   04/12/22 1436  TempSrc: Oral  PainSc: 0-No pain                 Barnet Glasgow

## 2022-04-12 NOTE — Discharge Instructions (Addendum)
Cystoscopy with injection of Mitomycin to urethral stricture patient instructions  Following a cystoscopy, a catheter (a flexible rubber tube) is sometimes left in place to empty the bladder. This may cause some discomfort or a feeling that you need to urinate. Your doctor determines the period of time that the catheter will be left in place. You may have bloody urine for two to three days (Call your doctor if the amount of bleeding increases or does not subside).  You may pass blood clots in your urine, especially if you had a biopsy. It is not unusual to pass small blood clots and have some bloody urine a couple of weeks after your cystoscopy. Again, call your doctor if the bleeding does not subside. You may have: Dysuria (painful urination) Frequency (urinating often) Urgency (strong desire to urinate)  **Use a condom for the next 30 days during sexual intercourse**  These symptoms are common especially if medicine is instilled into the bladder or a ureteral stent is placed. Avoiding alcohol and caffeine, such as coffee, tea, and chocolate, may help relieve these symptoms. Drink plenty of water, unless otherwise instructed. Your doctor may also prescribe an antibiotic or other medicine to reduce these symptoms.  Cystoscopy results are available soon after the procedure; biopsy results usually take two to four days. Your doctor will discuss the results of your exam with you. Before you go home, you will be given specific instructions for follow-up care. Special Instructions:   If you are going home with a catheter in place do not take a tub bath until removed by your doctor.   You may resume your normal activities.   Do not drive or operate machinery if you are taking narcotic pain medicine.   Be sure to keep all follow-up appointments with your doctor.   Call Your Doctor If: The catheter is not draining You have severe pain You are unable to urinate You have a fever over 101 You have  severe bleeding          Post Anesthesia Home Care Instructions  Activity: Get plenty of rest for the remainder of the day. A responsible adult should stay with you for 24 hours following the procedure.  For the next 24 hours, DO NOT: -Drive a car -Paediatric nurse -Drink alcoholic beverages -Take any medication unless instructed by your physician -Make any legal decisions or sign important papers.  Meals: Start with liquid foods such as gelatin or soup. Progress to regular foods as tolerated. Avoid greasy, spicy, heavy foods. If nausea and/or vomiting occur, drink only clear liquids until the nausea and/or vomiting subsides. Call your physician if vomiting continues.  Special Instructions/Symptoms: Your throat may feel dry or sore from the anesthesia or the breathing tube placed in your throat during surgery. If this causes discomfort, gargle with warm salt water. The discomfort should disappear within 24 hours.

## 2022-04-12 NOTE — Transfer of Care (Signed)
Immediate Anesthesia Transfer of Care Note  Patient: Chris Burton  Procedure(s) Performed: CYSTOSCOPY WITH INJECTION OF MITOMYCIN (Urethra)  Patient Location: PACU  Anesthesia Type:General  Level of Consciousness: awake, alert , oriented and patient cooperative  Airway & Oxygen Therapy: Patient Spontanous Breathing and Patient connected to nasal cannula oxygen  Post-op Assessment: Report given to RN and Post -op Vital signs reviewed and stable  Post vital signs: Reviewed and stable  Last Vitals:  Vitals Value Taken Time  BP 120/74 04/12/22 1404  Temp    Pulse 78 04/12/22 1405  Resp 8 04/12/22 1404  SpO2 97 % 04/12/22 1405  Vitals shown include unvalidated device data.  Last Pain:  Vitals:   04/12/22 1156  TempSrc: Oral      Patients Stated Pain Goal: 4 (68/93/40 6840)  Complications: No notable events documented.

## 2022-04-12 NOTE — Op Note (Signed)
Operative Note  Preoperative diagnosis:  1.  Membranous/prostatic urethral stricture  Postoperative diagnosis: 1.  Membranous/prostatic urethral stricture  Procedure(s): 1.  Cystoscopy with injection of mitomycin at urethral stricture site  Surgeon: Jacalyn Lefevre, MD  Assistants:  None  Anesthesia:  General  Complications:  None  EBL:  minimal  Specimens: 1. none  Drains/Catheters: 1.  16Fr foley catheter  Intraoperative findings:   Normal anterior urethra Membranous/prostatic urethra with pale mucosa and mild dystrophic calcification near bladder neck No good coaptation of sphincter  4.  Bilateral orthotopic ureteral orifices seen effluxing clear yellow urine 5.  No bladder masses noted 6.  Moderate trabeculation  Indication:  Chris Burton is a 81 y.o. male with a history of prostate cancer treated with brachytherapy over 20 years ago now with recurrent high-grade membranous/prostatic urethral stricture. He has undergone repeated urethral dilation and most recently on 03/12/2022. He also underwent a laser incision of urethral stricture in January 2023.   Description of procedure:  After risks and benefits of the procedure discussed the patient in detail, informed consent was obtained. Patient was taken to the operating room placed in supine position.  Anesthesia was induced and antibiotics were administered.  The patient was repositioned in the dorsolithotomy position.  He was prepped and draped in usual sterile fashion a timeout was performed.  A 21 French rigid cystoscope was placed in the urethral meatus and advanced to the membranous urethra at which time the urethral stricture site was encountered.  The membranous/prostatic urethra was wide open and 21Fr rigid cystoscope easily traversed this area. Mitomycin 0.4 mg/mL was then injected circumferentially around the stricture site for a total of 24m.  A 16 French urethral Foley catheter was left at the end of the  case.  Patient emerged from anesthesia and transferred back in stable condition.  Plan:  Discharge home with foley catheter in place

## 2022-04-12 NOTE — Anesthesia Procedure Notes (Signed)
Procedure Name: LMA Insertion Date/Time: 04/12/2022 12:55 PM  Performed by: Georgeanne Nim, CRNAPre-anesthesia Checklist: Patient identified, Emergency Drugs available, Suction available, Patient being monitored and Timeout performed Patient Re-evaluated:Patient Re-evaluated prior to induction Oxygen Delivery Method: Circle system utilized Preoxygenation: Pre-oxygenation with 100% oxygen Induction Type: IV induction Ventilation: Mask ventilation without difficulty LMA: LMA inserted LMA Size: 4.0 Number of attempts: 1 Placement Confirmation: positive ETCO2, CO2 detector and breath sounds checked- equal and bilateral Tube secured with: Tape Dental Injury: Teeth and Oropharynx as per pre-operative assessment

## 2022-04-12 NOTE — H&P (Signed)
CC/HPI: cc: urethral stricture   02/01/21: 81 year old man with a history of prostate cancer status post brachytherapy over 20 years ago who recently underwent bilateral inguinal hernia repair found to have urethral stricture intraoperatively during attempted Foley insertion. Cystoscopy with urethral balloon dilation was performed for recurrent prostatic urethral stricture. Per chart review, patient was last seen at [facility] in 2017 by Dr. Tresa Moore. He underwent cystoscopy with dilation of high-grade prostatic stricture at that time. After Foley catheter was removed he experienced new onset incontinence.   05/06/21: 81 year old man with the above history here for follow-up. His PVR today is 20 cc. He does feel like he has a stable force of stream. He has been paying attention to his stream more closely following urethral dilation. He has stable urinary leakage that has been present for many years.   09/01/21: 81 year old man with history of prostate cancer status post brachytherapy also with prostatic urethral stricture who has undergone urethral dilation x2 here for follow-up. He noticed that his stream has decreased over the last month. He has had to strain to fully empty his bladder. No hematuria. No increased urinary leakage. PVR 0 cc today.   09/10/2021: 81 year old male who presents today for follow-up PVR after he removed his Foley catheter. He is about to go to [place] and is here for learning CIC as well as follow-up PVR. He reports he is voiding well. He denies fevers and chills. He denies difficulty starting his stream. PVR today is low.   03/16/2022: 81 year old man with a history of prostate cancer treated with brachytherapy over 20 years ago now with recurrent high-grade membranous/prostatic urethral stricture. He has undergone repeated urethral dilation and most recently on 03/12/2022. He also underwent a laser incision of urethral stricture in January 2023. After that he had significant  urinary incontinence. He is currently on Flagyl and Levaquin for colitis flare. His plan is to go back to [place] November 28. He is lost 12 pounds due to acute decreased appetite over the last several weeks.   03/25/2022: 81 year old man with the above history returns after he went to his PCP to have a urinalysis obtained to see if urinary tract infection was gone. He was asymptomatic at that time. Apparently urine culture showed Klebsiella however we do not have these records. He was then treated with a course of levofloxacin. Patient says incontinence is significantly improved. His GI symptoms are also improved and he is being followed by GI doctor in [place]. He is scheduled to undergo cystoscopy with injection of mitomycin at stricture site next month.     ALLERGIES: Banana Methylparaben    MEDICATIONS: Nexium 40 mg capsule,delayed release 1 capsule PO Daily  Betamethasone Dipropionate 0.05 % cream 1 PO Daily  Ciclopirox 1 PO Daily  Flagyl  Fluticasone Propionate 1 PO Daily  Gabapentin 300 mg capsule  Glucosamine-Chondroitin 1 PO Daily  Levofloxacin  Probenecid 500 mg tablet 1 tablet PO Daily  Probenecid  Probiotic  Psyllium Fiber 0.4 gram capsule 1 capsule PO Daily  Retin-A 1 PO Daily  Slow-Mag 1 PO Daily  Stool Softener 1 PO Daily  Tylenol Extra Strength 500 mg tablet  Valacyclovir 500 mg tablet 1 tablet PO Daily  Vitamin D3 25 mcg (1,000 unit) capsule 1 capsule PO Daily     GU PSH: Cysto Dilate Stricture (M or F) - 03/12/2022, 09/07/2021, 2017 Cystoscopy Insert Stent - 2012, 2012, 2012 Ureteroscopic stone removal - 2012       PSH Notes: Cystoscopy With Insertion  Of Ureteral Stent Right, Cystoscopy With Ureteroscopy With Removal Of Calculus, Cystoscopy With Insertion Of Ureteral Stent Right, Cystoscopy With Insertion Of Ureteral Stent Right   NON-GU PSH: Tonsillectomy..     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 Smoked for 10 years. Smoked 2 packs per day.  Has never drank.  Drinks 2 caffeinated drinks per day. Patient's occupation is/was Retired.    REVIEW OF SYSTEMS:    GU Review Male:   Patient reports frequent urination and burning/ pain with urination. Patient denies hard to postpone urination, get up at night to urinate, leakage of urine, stream starts and stops, trouble starting your stream, have to strain to urinate , erection problems, and penile pain.  Gastrointestinal (Upper):   Patient denies nausea, vomiting, and indigestion/ heartburn.  Gastrointestinal (Lower):   Patient denies diarrhea and constipation.  Constitutional:   Patient denies fever, night sweats, weight loss, and fatigue.  Skin:   Patient denies skin rash/ lesion and itching.  Eyes:   Patient denies blurred vision and double vision.  Ears/ Nose/ Throat:   Patient denies sore throat and sinus problems.  Hematologic/Lymphatic:   Patient denies swollen glands and easy bruising.  Cardiovascular:   Patient denies leg swelling and chest pains.  Respiratory:   Patient denies cough and shortness of breath.  Endocrine:   Patient denies  excessive thirst.  Musculoskeletal:   Patient denies back pain and joint pain.  Neurological:   Patient denies headaches and dizziness.  Psychologic:   Patient denies depression and anxiety.   VITAL SIGNS:      03/25/2022 08:17 AM  BP 131/91 mmHg  Pulse 76 /min  Temperature 97.3 F / 36.2 C   MULTI-SYSTEM PHYSICAL EXAMINATION:    Constitutional: Well-nourished. No physical deformities. Normally developed. Good grooming.  Neck: Neck symmetrical, not swollen. Normal tracheal position.  Respiratory: No labored breathing, no use of accessory muscles.   Skin: No paleness, no jaundice, no cyanosis. No lesion, no ulcer, no rash.  Neurologic / Psychiatric: Oriented to time, oriented to place, oriented to person. No depression, no anxiety, no agitation.  Eyes: Normal conjunctivae. Normal eyelids.  Ears, Nose, Mouth, and Throat: Left ear no scars, no lesions, no masses. Right ear no scars, no lesions, no masses. Nose no scars, no lesions, no masses. Normal hearing. Normal lips.  Musculoskeletal: Normal gait and station of head and neck.     Complexity of Data:  Records Review:   Previous Patient Records, POC Tool  Urine Test Review:   Urinalysis   03/16/22  PSA  Total PSA <0.015 ng/mL   Notes:                     03/12/2022:  BUN 24, creatinine 1.7   PROCEDURES:          Urinalysis w/Scope Dipstick Dipstick Cont'd Micro  Color: Yellow Bilirubin: Neg mg/dL WBC/hpf: 40 - 60/hpf  Appearance: Clear Ketones: Neg mg/dL RBC/hpf: 10 - 20/hpf  Specific Gravity: 1.025 Blood: 3+ ery/uL Bacteria: Few (10-25/hpf)  pH: 5.5 Protein: 1+ mg/dL Cystals: NS (Not Seen)  Glucose: Neg mg/dL Urobilinogen: 0.2 mg/dL Casts: Hyaline    Nitrites: Neg Trichomonas: Not Present    Leukocyte Esterase: 3+ leu/uL Mucous: Present      Epithelial Cells: 0 - 5/hpf      Yeast: NS (Not Seen)      Sperm: Not Present    ASSESSMENT:      ICD-10 Details  1 GU:   Prostate Cancer - C61 Chronic, Stable  2   Postprocedural  urethral stricture, male, Unspec - N99.114 Chronic, Stable  3   Stress Incontinence - N39.3 Chronic, Stable   PLAN:           Document Letter(s):  Created for Patient: Clinical Summary         Notes:   Recurrent high-grade membranous urethral stricture: Patient's Foley catheter was removed today. I did counsel him that he may continue to have urinary incontinence now that stricture has been dilated. Discussed with patient electively planning cystoscopy with Mitomycin injection. Risks and benefits of procedure discussed with the patient specifically continued stricture recurrence, need for additional procedure, bleeding, infection, urinary continence, damage surrounding structures.   Prostate cancer: Undetectable   UTI: I discussed with patient not checking urinalyses when he is asymptomatic. If bacteria is found and treated when he is asymptomatic this will select out for multidrug-resistant bacteria. I encouraged him to drink plenty of water and let me know if he develops change in urinary symptoms. We also discussed that I am unsure what his urethra and prostatic urethra look like after procedure he had done in [place] and with stricture. I will fully evaluate this in the operating room and he may have worsening incontinence following treatment of stricture if it has recurred.

## 2022-04-12 NOTE — Interval H&P Note (Signed)
History and Physical Interval Note:  04/12/2022 12:19 PM  Aura Dials  has presented today for surgery, with the diagnosis of URETHRAL STRICTURE.  The various methods of treatment have been discussed with the patient and family. After consideration of risks, benefits and other options for treatment, the patient has consented to  Procedure(s) with comments: CYSTOSCOPY WITH URETHRAL DILATATION (N/A) - 1 HR CYSTOSCOPY WITH INJECTION OF MITOMYCIN (N/A) as a surgical intervention.  The patient's history has been reviewed, patient examined, no change in status, stable for surgery.  I have reviewed the patient's chart and labs.  Questions were answered to the patient's satisfaction.     Enya Bureau D Mckynna Vanloan

## 2022-04-13 ENCOUNTER — Encounter (HOSPITAL_BASED_OUTPATIENT_CLINIC_OR_DEPARTMENT_OTHER): Payer: Self-pay | Admitting: Urology

## 2023-10-05 NOTE — Progress Notes (Signed)
 Chart reviewed complete for [employer] forms project.  Form submitted for AWV DOS 09/19/2023.

## 2024-05-28 ENCOUNTER — Emergency Department (HOSPITAL_COMMUNITY)

## 2024-05-28 ENCOUNTER — Other Ambulatory Visit: Payer: Self-pay

## 2024-05-28 ENCOUNTER — Inpatient Hospital Stay (HOSPITAL_COMMUNITY)
Admission: EM | Admit: 2024-05-28 | Discharge: 2024-06-02 | DRG: 522 | Disposition: A | Discharge status: Home Health | Attending: Internal Medicine | Admitting: Internal Medicine

## 2024-05-28 ENCOUNTER — Encounter (HOSPITAL_COMMUNITY): Payer: Self-pay | Admitting: Radiology

## 2024-05-28 DIAGNOSIS — E875 Hyperkalemia: Secondary | ICD-10-CM | POA: Diagnosis present

## 2024-05-28 DIAGNOSIS — Z888 Allergy status to other drugs, medicaments and biological substances status: Secondary | ICD-10-CM

## 2024-05-28 DIAGNOSIS — D72829 Elevated white blood cell count, unspecified: Secondary | ICD-10-CM | POA: Diagnosis not present

## 2024-05-28 DIAGNOSIS — Z833 Family history of diabetes mellitus: Secondary | ICD-10-CM | POA: Diagnosis not present

## 2024-05-28 DIAGNOSIS — Z91018 Allergy to other foods: Secondary | ICD-10-CM

## 2024-05-28 DIAGNOSIS — N1832 Chronic kidney disease, stage 3b: Secondary | ICD-10-CM | POA: Diagnosis present

## 2024-05-28 DIAGNOSIS — Z860101 Personal history of adenomatous and serrated colon polyps: Secondary | ICD-10-CM

## 2024-05-28 DIAGNOSIS — Z801 Family history of malignant neoplasm of trachea, bronchus and lung: Secondary | ICD-10-CM | POA: Diagnosis not present

## 2024-05-28 DIAGNOSIS — Z8546 Personal history of malignant neoplasm of prostate: Secondary | ICD-10-CM

## 2024-05-28 DIAGNOSIS — Z85828 Personal history of other malignant neoplasm of skin: Secondary | ICD-10-CM | POA: Diagnosis not present

## 2024-05-28 DIAGNOSIS — N401 Enlarged prostate with lower urinary tract symptoms: Secondary | ICD-10-CM | POA: Diagnosis present

## 2024-05-28 DIAGNOSIS — E782 Mixed hyperlipidemia: Secondary | ICD-10-CM | POA: Diagnosis present

## 2024-05-28 DIAGNOSIS — D696 Thrombocytopenia, unspecified: Secondary | ICD-10-CM | POA: Diagnosis not present

## 2024-05-28 DIAGNOSIS — D62 Acute posthemorrhagic anemia: Secondary | ICD-10-CM | POA: Diagnosis not present

## 2024-05-28 DIAGNOSIS — S0990XA Unspecified injury of head, initial encounter: Secondary | ICD-10-CM | POA: Diagnosis present

## 2024-05-28 DIAGNOSIS — Z9849 Cataract extraction status, unspecified eye: Secondary | ICD-10-CM | POA: Diagnosis not present

## 2024-05-28 DIAGNOSIS — S72001A Fracture of unspecified part of neck of right femur, initial encounter for closed fracture: Principal | ICD-10-CM | POA: Diagnosis present

## 2024-05-28 DIAGNOSIS — Y9301 Activity, walking, marching and hiking: Secondary | ICD-10-CM | POA: Diagnosis present

## 2024-05-28 DIAGNOSIS — Z806 Family history of leukemia: Secondary | ICD-10-CM

## 2024-05-28 DIAGNOSIS — Y92410 Unspecified street and highway as the place of occurrence of the external cause: Secondary | ICD-10-CM | POA: Diagnosis not present

## 2024-05-28 DIAGNOSIS — E872 Acidosis, unspecified: Secondary | ICD-10-CM | POA: Diagnosis present

## 2024-05-28 DIAGNOSIS — Z961 Presence of intraocular lens: Secondary | ICD-10-CM | POA: Diagnosis present

## 2024-05-28 DIAGNOSIS — Z800 Family history of malignant neoplasm of digestive organs: Secondary | ICD-10-CM

## 2024-05-28 DIAGNOSIS — K219 Gastro-esophageal reflux disease without esophagitis: Secondary | ICD-10-CM | POA: Diagnosis present

## 2024-05-28 DIAGNOSIS — S72011A Unspecified intracapsular fracture of right femur, initial encounter for closed fracture: Secondary | ICD-10-CM | POA: Diagnosis present

## 2024-05-28 DIAGNOSIS — Z87891 Personal history of nicotine dependence: Secondary | ICD-10-CM

## 2024-05-28 DIAGNOSIS — Z8042 Family history of malignant neoplasm of prostate: Secondary | ICD-10-CM | POA: Diagnosis not present

## 2024-05-28 DIAGNOSIS — D6959 Other secondary thrombocytopenia: Secondary | ICD-10-CM | POA: Diagnosis present

## 2024-05-28 DIAGNOSIS — E8809 Other disorders of plasma-protein metabolism, not elsewhere classified: Secondary | ICD-10-CM | POA: Diagnosis present

## 2024-05-28 DIAGNOSIS — N183 Chronic kidney disease, stage 3 unspecified: Secondary | ICD-10-CM | POA: Diagnosis present

## 2024-05-28 DIAGNOSIS — M109 Gout, unspecified: Secondary | ICD-10-CM | POA: Diagnosis present

## 2024-05-28 DIAGNOSIS — Z7982 Long term (current) use of aspirin: Secondary | ICD-10-CM | POA: Diagnosis not present

## 2024-05-28 DIAGNOSIS — I959 Hypotension, unspecified: Secondary | ICD-10-CM | POA: Diagnosis present

## 2024-05-28 DIAGNOSIS — I129 Hypertensive chronic kidney disease with stage 1 through stage 4 chronic kidney disease, or unspecified chronic kidney disease: Secondary | ICD-10-CM | POA: Diagnosis present

## 2024-05-28 DIAGNOSIS — Z923 Personal history of irradiation: Secondary | ICD-10-CM | POA: Diagnosis not present

## 2024-05-28 DIAGNOSIS — N39498 Other specified urinary incontinence: Secondary | ICD-10-CM | POA: Diagnosis present

## 2024-05-28 DIAGNOSIS — G4733 Obstructive sleep apnea (adult) (pediatric): Secondary | ICD-10-CM | POA: Diagnosis present

## 2024-05-28 DIAGNOSIS — I10 Essential (primary) hypertension: Secondary | ICD-10-CM | POA: Diagnosis not present

## 2024-05-28 DIAGNOSIS — Z87442 Personal history of urinary calculi: Secondary | ICD-10-CM

## 2024-05-28 DIAGNOSIS — K5900 Constipation, unspecified: Secondary | ICD-10-CM | POA: Diagnosis present

## 2024-05-28 LAB — BASIC METABOLIC PANEL WITH GFR
Anion gap: 11 (ref 5–15)
BUN: 22 mg/dL (ref 8–23)
CO2: 21 mmol/L — ABNORMAL LOW (ref 22–32)
Calcium: 9.2 mg/dL (ref 8.9–10.3)
Chloride: 110 mmol/L (ref 98–111)
Creatinine, Ser: 1.7 mg/dL — ABNORMAL HIGH (ref 0.61–1.24)
GFR, Estimated: 39 mL/min — ABNORMAL LOW (ref >60)
Glucose, Bld: 84 mg/dL (ref 70–99)
Potassium: 4.9 mmol/L (ref 3.5–5.1)
Sodium: 142 mmol/L (ref 135–145)

## 2024-05-28 LAB — CBC
HCT: 43.7 % (ref 39.0–52.0)
Hemoglobin: 13.7 g/dL (ref 13.0–17.0)
MCH: 30.6 pg (ref 26.0–34.0)
MCHC: 31.4 g/dL (ref 30.0–36.0)
MCV: 97.8 fL (ref 80.0–100.0)
Platelets: 152 K/uL (ref 150–400)
RBC: 4.47 MIL/uL (ref 4.22–5.81)
RDW: 13.2 % (ref 11.5–15.5)
WBC: 7.8 K/uL (ref 4.0–10.5)
nRBC: 0 % (ref 0.0–0.2)

## 2024-05-28 LAB — TYPE AND SCREEN
ABO/RH(D): O NEG
Antibody Screen: NEGATIVE

## 2024-05-28 LAB — ABO/RH: ABO/RH(D): O NEG

## 2024-05-28 MED ORDER — ENSURE PRE-SURGERY PO LIQD
296.0000 mL | Freq: Once | ORAL | Status: AC
  Administered 2024-05-29: 296 mL via ORAL
  Filled 2024-05-28: qty 296

## 2024-05-28 MED ORDER — PANTOPRAZOLE SODIUM 40 MG PO TBEC
40.0000 mg | DELAYED_RELEASE_TABLET | Freq: Every day | ORAL | Status: DC
  Administered 2024-05-28 – 2024-06-02 (×6): 40 mg via ORAL
  Filled 2024-05-28 (×6): qty 1

## 2024-05-28 MED ORDER — FENTANYL CITRATE PF 50 MCG/ML IJ SOSY
50.0000 ug | PREFILLED_SYRINGE | Freq: Once | INTRAMUSCULAR | Status: AC
  Administered 2024-05-28: 50 ug via INTRAVENOUS
  Filled 2024-05-28: qty 1

## 2024-05-28 MED ORDER — MORPHINE SULFATE (PF) 2 MG/ML IV SOLN
2.0000 mg | INTRAVENOUS | Status: DC | PRN
  Administered 2024-05-28 – 2024-05-29 (×2): 2 mg via INTRAVENOUS
  Filled 2024-05-28 (×3): qty 1

## 2024-05-28 MED ORDER — OXYCODONE HCL 5 MG PO TABS
5.0000 mg | ORAL_TABLET | Freq: Four times a day (QID) | ORAL | Status: DC | PRN
  Filled 2024-05-28: qty 1

## 2024-05-28 MED ORDER — METHOCARBAMOL 1000 MG/10ML IJ SOLN
500.0000 mg | Freq: Four times a day (QID) | INTRAMUSCULAR | Status: DC | PRN
  Administered 2024-05-29: 500 mg via INTRAVENOUS
  Filled 2024-05-28: qty 10

## 2024-05-28 MED ORDER — METHOCARBAMOL 500 MG PO TABS
500.0000 mg | ORAL_TABLET | Freq: Four times a day (QID) | ORAL | Status: DC | PRN
  Administered 2024-05-28 – 2024-06-02 (×8): 500 mg via ORAL
  Filled 2024-05-28 (×10): qty 1

## 2024-05-28 NOTE — Assessment & Plan Note (Signed)
-   management as per orthopedics,  plan to operate   in  a.m.  Keep nothing by mouth post midnight. Patient   not on anticoagulation or antiplatelet agents   Ordered type and screen, , order a vitamin D level  Patient at baseline  able to walk a flight of stairs or 100 feet     Patient denies any chest pain or shortness of breath currently and/or with exertion,  ECG showing no evidence of acute ischemia  no known history of coronary artery disease,  COPD   Liver failure   Known CKD 3b stable  Given advanced age patient is at least moderate  risk  which has been discussed with family but at this point no furthther cardiac workup is indicated.

## 2024-05-28 NOTE — Progress Notes (Signed)
 Case dw EDP.  Will need right THA.  Plan for surgery tomorrow with Dr. Fidel.  NPO tonight at [facility].  Thanks for hospitalist help and admission. Consult note to come in AM.
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
## 2024-05-28 NOTE — Plan of Care (Signed)
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
## 2024-05-28 NOTE — Assessment & Plan Note (Signed)
-  chronic avoid nephrotoxic medications such as NSAIDs, Vanco Zosyn combo,  avoid hypotension, continue to follow renal function

## 2024-05-28 NOTE — Assessment & Plan Note (Signed)
 In remission.

## 2024-05-28 NOTE — ED Notes (Signed)
 Lab called for Add-On tests.

## 2024-05-28 NOTE — Assessment & Plan Note (Signed)
 Not on statin
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
## 2024-05-29 ENCOUNTER — Encounter (HOSPITAL_COMMUNITY): Payer: Self-pay | Admitting: Internal Medicine

## 2024-05-29 ENCOUNTER — Ambulatory Visit: Payer: Self-pay | Admitting: Student

## 2024-05-29 ENCOUNTER — Inpatient Hospital Stay (HOSPITAL_COMMUNITY)

## 2024-05-29 ENCOUNTER — Encounter (HOSPITAL_COMMUNITY)
Admission: EM | Disposition: A | Discharge status: Home Health | Payer: Self-pay | Source: Home / Self Care | Attending: Internal Medicine

## 2024-05-29 DIAGNOSIS — Z8546 Personal history of malignant neoplasm of prostate: Secondary | ICD-10-CM | POA: Diagnosis not present

## 2024-05-29 DIAGNOSIS — I10 Essential (primary) hypertension: Secondary | ICD-10-CM

## 2024-05-29 DIAGNOSIS — N1832 Chronic kidney disease, stage 3b: Secondary | ICD-10-CM | POA: Diagnosis not present

## 2024-05-29 DIAGNOSIS — S72001A Fracture of unspecified part of neck of right femur, initial encounter for closed fracture: Secondary | ICD-10-CM | POA: Diagnosis not present

## 2024-05-29 DIAGNOSIS — E782 Mixed hyperlipidemia: Secondary | ICD-10-CM | POA: Diagnosis not present

## 2024-05-29 DIAGNOSIS — Z87891 Personal history of nicotine dependence: Secondary | ICD-10-CM

## 2024-05-29 HISTORY — PX: TOTAL HIP ARTHROPLASTY: SHX124

## 2024-05-29 LAB — CBC WITH DIFFERENTIAL/PLATELET
Abs Immature Granulocytes: 0.07 K/uL (ref 0.00–0.07)
Basophils Absolute: 0 K/uL (ref 0.0–0.1)
Basophils Relative: 0 %
Eosinophils Absolute: 0.1 K/uL (ref 0.0–0.5)
Eosinophils Relative: 1 %
HCT: 41.4 % (ref 39.0–52.0)
Hemoglobin: 13.3 g/dL (ref 13.0–17.0)
Immature Granulocytes: 1 %
Lymphocytes Relative: 19 %
Lymphs Abs: 1.8 K/uL (ref 0.7–4.0)
MCH: 30.8 pg (ref 26.0–34.0)
MCHC: 32.1 g/dL (ref 30.0–36.0)
MCV: 95.8 fL (ref 80.0–100.0)
Monocytes Absolute: 1.1 K/uL — ABNORMAL HIGH (ref 0.1–1.0)
Monocytes Relative: 11 %
Neutro Abs: 6.4 K/uL (ref 1.7–7.7)
Neutrophils Relative %: 68 %
Platelets: 154 K/uL (ref 150–400)
RBC: 4.32 MIL/uL (ref 4.22–5.81)
RDW: 13.1 % (ref 11.5–15.5)
WBC: 9.4 K/uL (ref 4.0–10.5)
nRBC: 0 % (ref 0.0–0.2)

## 2024-05-29 LAB — COMPREHENSIVE METABOLIC PANEL WITH GFR
ALT: 22 U/L (ref 0–44)
AST: 26 U/L (ref 15–41)
Albumin: 4 g/dL (ref 3.5–5.0)
Alkaline Phosphatase: 76 U/L (ref 38–126)
Anion gap: 11 (ref 5–15)
BUN: 17 mg/dL (ref 8–23)
CO2: 22 mmol/L (ref 22–32)
Calcium: 8.9 mg/dL (ref 8.9–10.3)
Chloride: 106 mmol/L (ref 98–111)
Creatinine, Ser: 1.57 mg/dL — ABNORMAL HIGH (ref 0.61–1.24)
GFR, Estimated: 43 mL/min — ABNORMAL LOW (ref >60)
Glucose, Bld: 121 mg/dL — ABNORMAL HIGH (ref 70–99)
Potassium: 4.5 mmol/L (ref 3.5–5.1)
Sodium: 139 mmol/L (ref 135–145)
Total Bilirubin: 0.5 mg/dL (ref 0.0–1.2)
Total Protein: 5.9 g/dL — ABNORMAL LOW (ref 6.5–8.1)

## 2024-05-29 LAB — HEPATIC FUNCTION PANEL
ALT: 25 U/L (ref 0–44)
AST: 30 U/L (ref 15–41)
Albumin: 4.2 g/dL (ref 3.5–5.0)
Alkaline Phosphatase: 88 U/L (ref 38–126)
Bilirubin, Direct: 0.2 mg/dL (ref 0.0–0.2)
Indirect Bilirubin: 0.3 mg/dL (ref 0.3–0.9)
Total Bilirubin: 0.5 mg/dL (ref 0.0–1.2)
Total Protein: 6.5 g/dL (ref 6.5–8.1)

## 2024-05-29 LAB — VITAMIN D 25 HYDROXY (VIT D DEFICIENCY, FRACTURES): Vit D, 25-Hydroxy: 23.64 ng/mL — ABNORMAL LOW (ref 30–100)

## 2024-05-29 LAB — MAGNESIUM: Magnesium: 2 mg/dL (ref 1.7–2.4)

## 2024-05-29 LAB — CK: Total CK: 262 U/L (ref 49–397)

## 2024-05-29 LAB — PHOSPHORUS: Phosphorus: 1.9 mg/dL — ABNORMAL LOW (ref 2.5–4.6)

## 2024-05-29 LAB — SURGICAL PCR SCREEN
MRSA, PCR: NEGATIVE
Staphylococcus aureus: NEGATIVE

## 2024-05-29 SURGERY — ARTHROPLASTY, HIP, TOTAL, ANTERIOR APPROACH
Anesthesia: General | Site: Hip | Laterality: Right

## 2024-05-29 MED ORDER — ACETAMINOPHEN 10 MG/ML IV SOLN
INTRAVENOUS | Status: DC | PRN
  Administered 2024-05-29: 1000 mg via INTRAVENOUS

## 2024-05-29 MED ORDER — METOCLOPRAMIDE HCL 5 MG PO TABS: 5.0000 mg | ORAL_TABLET | Freq: Three times a day (TID) | ORAL | Status: DC | PRN

## 2024-05-29 MED ORDER — ROCURONIUM BROMIDE 10 MG/ML (PF) SYRINGE
PREFILLED_SYRINGE | INTRAVENOUS | Status: AC
  Filled 2024-05-29: qty 10

## 2024-05-29 MED ORDER — PHENYLEPHRINE HCL-NACL 20-0.9 MG/250ML-% IV SOLN
INTRAVENOUS | Status: AC
  Filled 2024-05-29: qty 250

## 2024-05-29 MED ORDER — FENTANYL CITRATE (PF) 100 MCG/2ML IJ SOLN
INTRAMUSCULAR | Status: DC | PRN
  Administered 2024-05-29: 100 ug via INTRAVENOUS
  Administered 2024-05-29: 50 ug via INTRAVENOUS

## 2024-05-29 MED ORDER — ACETAMINOPHEN 10 MG/ML IV SOLN: 1000.0000 mg | Freq: Once | INTRAVENOUS | Status: DC | PRN

## 2024-05-29 MED ORDER — ADULT MULTIVITAMIN W/MINERALS CH
1.0000 | ORAL_TABLET | Freq: Every day | ORAL | Status: DC
  Administered 2024-05-30 – 2024-06-02 (×4): 1 via ORAL
  Filled 2024-05-29 (×5): qty 1

## 2024-05-29 MED ORDER — PHENYLEPHRINE 80 MCG/ML (10ML) SYRINGE FOR IV PUSH (FOR BLOOD PRESSURE SUPPORT)
PREFILLED_SYRINGE | INTRAVENOUS | Status: AC
Start: 2024-05-29 — End: 2024-05-29
  Filled 2024-05-29: qty 10

## 2024-05-29 MED ORDER — CHLORHEXIDINE GLUCONATE 4 % EX SOLN
60.0000 mL | Freq: Once | CUTANEOUS | Status: DC
Start: 2024-05-29 — End: 2024-05-29

## 2024-05-29 MED ORDER — SUGAMMADEX SODIUM 200 MG/2ML IV SOLN
INTRAVENOUS | Status: AC
Start: 2024-05-29 — End: 2024-05-29
  Filled 2024-05-29: qty 2

## 2024-05-29 MED ORDER — HYDROCODONE-ACETAMINOPHEN 5-325 MG PO TABS
1.0000 | ORAL_TABLET | ORAL | Status: DC | PRN
  Administered 2024-05-29 – 2024-06-02 (×10): 1 via ORAL
  Filled 2024-05-29 (×13): qty 1

## 2024-05-29 MED ORDER — ONDANSETRON HCL 4 MG/2ML IJ SOLN
INTRAMUSCULAR | Status: AC
  Filled 2024-05-29: qty 2

## 2024-05-29 MED ORDER — CEFAZOLIN SODIUM-DEXTROSE 2-4 GM/100ML-% IV SOLN
2.0000 g | INTRAVENOUS | Status: AC
  Administered 2024-05-29: 2 g via INTRAVENOUS
  Filled 2024-05-29: qty 100

## 2024-05-29 MED ORDER — OXYCODONE HCL 5 MG/5ML PO SOLN: 5.0000 mg | Freq: Once | ORAL | Status: DC | PRN

## 2024-05-29 MED ORDER — POLYETHYLENE GLYCOL 3350 17 G PO PACK: 17.0000 g | PACK | Freq: Every day | ORAL | Status: DC | PRN

## 2024-05-29 MED ORDER — DEXMEDETOMIDINE HCL IN NACL 80 MCG/20ML IV SOLN
INTRAVENOUS | Status: DC | PRN
  Administered 2024-05-29 (×3): 4 ug via INTRAVENOUS

## 2024-05-29 MED ORDER — PROPOFOL 10 MG/ML IV BOLUS
INTRAVENOUS | Status: DC | PRN
  Administered 2024-05-29: 110 mg via INTRAVENOUS
  Administered 2024-05-29: 125 ug/kg/min via INTRAVENOUS

## 2024-05-29 MED ORDER — ROCURONIUM BROMIDE 100 MG/10ML IV SOLN
INTRAVENOUS | Status: DC | PRN
  Administered 2024-05-29: 20 mg via INTRAVENOUS
  Administered 2024-05-29: 50 mg via INTRAVENOUS
  Administered 2024-05-29: 20 mg via INTRAVENOUS

## 2024-05-29 MED ORDER — OXYCODONE HCL 5 MG PO TABS: 5.0000 mg | ORAL_TABLET | Freq: Once | ORAL | Status: DC | PRN

## 2024-05-29 MED ORDER — DEXAMETHASONE SODIUM PHOSPHATE 10 MG/ML IJ SOLN
INTRAMUSCULAR | Status: DC | PRN
  Administered 2024-05-29: 10 mg via INTRAVENOUS

## 2024-05-29 MED ORDER — ENSURE PLUS HIGH PROTEIN PO LIQD
237.0000 mL | Freq: Two times a day (BID) | ORAL | Status: DC
  Administered 2024-05-30 – 2024-06-02 (×6): 237 mL via ORAL
  Filled 2024-05-29 (×3): qty 237

## 2024-05-29 MED ORDER — SUGAMMADEX SODIUM 200 MG/2ML IV SOLN
INTRAVENOUS | Status: DC | PRN
Start: 2024-05-29 — End: 2024-05-29
  Administered 2024-05-29: 200 mg via INTRAVENOUS

## 2024-05-29 MED ORDER — SODIUM CHLORIDE (PF) 0.9 % IJ SOLN
INTRAMUSCULAR | Status: AC
Start: 2024-05-29 — End: 2024-05-29
  Filled 2024-05-29: qty 30

## 2024-05-29 MED ORDER — SODIUM CHLORIDE 0.9 % IV BOLUS: 1000.0000 mL | Freq: Once | INTRAVENOUS | Status: DC

## 2024-05-29 MED ORDER — LIDOCAINE HCL (PF) 2 % IJ SOLN
INTRAMUSCULAR | Status: AC
  Filled 2024-05-29: qty 5

## 2024-05-29 MED ORDER — CHLORHEXIDINE GLUCONATE 4 % EX SOLN: 60.0000 mL | Freq: Once | CUTANEOUS | Status: DC

## 2024-05-29 MED ORDER — BISACODYL 10 MG RE SUPP: 10.0000 mg | Freq: Every day | RECTAL | Status: DC | PRN

## 2024-05-29 MED ORDER — LACTATED RINGERS IV SOLN
INTRAVENOUS | Status: DC
Start: 2024-05-29 — End: 2024-05-29

## 2024-05-29 MED ORDER — TRANEXAMIC ACID-NACL 1000-0.7 MG/100ML-% IV SOLN
1000.0000 mg | INTRAVENOUS | Status: DC
Start: 2024-05-29 — End: 2024-05-29

## 2024-05-29 MED ORDER — SODIUM CHLORIDE 0.9 % IR SOLN
Status: DC | PRN
  Administered 2024-05-29: 3000 mL

## 2024-05-29 MED ORDER — KETOROLAC TROMETHAMINE 30 MG/ML IJ SOLN
INTRAMUSCULAR | Status: DC | PRN
  Administered 2024-05-29: 30 mg via INTRA_ARTICULAR

## 2024-05-29 MED ORDER — BUPIVACAINE-EPINEPHRINE (PF) 0.25% -1:200000 IJ SOLN
INTRAMUSCULAR | Status: AC
  Filled 2024-05-29: qty 30

## 2024-05-29 MED ORDER — ONDANSETRON HCL 4 MG/2ML IJ SOLN: 4.0000 mg | Freq: Four times a day (QID) | INTRAMUSCULAR | Status: DC | PRN

## 2024-05-29 MED ORDER — FENTANYL CITRATE (PF) 100 MCG/2ML IJ SOLN
INTRAMUSCULAR | Status: AC
  Filled 2024-05-29: qty 2

## 2024-05-29 MED ORDER — ALBUMIN HUMAN 5 % IV SOLN: INTRAVENOUS | Status: DC | PRN

## 2024-05-29 MED ORDER — FENTANYL CITRATE PF 50 MCG/ML IJ SOSY: 25.0000 ug | PREFILLED_SYRINGE | INTRAMUSCULAR | Status: DC | PRN

## 2024-05-29 MED ORDER — KETOROLAC TROMETHAMINE 30 MG/ML IJ SOLN
INTRAMUSCULAR | Status: AC
  Filled 2024-05-29: qty 1

## 2024-05-29 MED ORDER — SODIUM CHLORIDE (PF) 0.9 % IJ SOLN
INTRAMUSCULAR | Status: DC | PRN
  Administered 2024-05-29: 30 mL via INTRAVENOUS

## 2024-05-29 MED ORDER — SODIUM CHLORIDE 0.9 % IV SOLN: INTRAVENOUS | Status: DC

## 2024-05-29 MED ORDER — PROPOFOL 1000 MG/100ML IV EMUL
INTRAVENOUS | Status: AC
  Filled 2024-05-29: qty 100

## 2024-05-29 MED ORDER — SENNA 8.6 MG PO TABS
1.0000 | ORAL_TABLET | Freq: Two times a day (BID) | ORAL | Status: DC
  Administered 2024-05-29 – 2024-06-02 (×8): 8.6 mg via ORAL
  Filled 2024-05-29 (×8): qty 1

## 2024-05-29 MED ORDER — ASPIRIN 81 MG PO CHEW
81.0000 mg | CHEWABLE_TABLET | Freq: Two times a day (BID) | ORAL | Status: DC
  Administered 2024-05-29 – 2024-06-02 (×8): 81 mg via ORAL
  Filled 2024-05-29 (×8): qty 1

## 2024-05-29 MED ORDER — ORAL CARE MOUTH RINSE: 15.0000 mL | OROMUCOSAL | Status: DC | PRN

## 2024-05-29 MED ORDER — POVIDONE-IODINE 10 % EX SWAB: 2.0000 | Freq: Once | CUTANEOUS | Status: DC

## 2024-05-29 MED ORDER — PROBENECID 500 MG PO TABS
500.0000 mg | ORAL_TABLET | Freq: Two times a day (BID) | ORAL | Status: DC
  Administered 2024-05-30 – 2024-06-02 (×7): 500 mg via ORAL
  Filled 2024-05-29 (×7): qty 1

## 2024-05-29 MED ORDER — BUPIVACAINE-EPINEPHRINE (PF) 0.25% -1:200000 IJ SOLN
INTRAMUSCULAR | Status: DC | PRN
  Administered 2024-05-29: 30 mL

## 2024-05-29 MED ORDER — METOCLOPRAMIDE HCL 5 MG/ML IJ SOLN: 5.0000 mg | Freq: Three times a day (TID) | INTRAMUSCULAR | Status: DC | PRN

## 2024-05-29 MED ORDER — CEFAZOLIN SODIUM-DEXTROSE 2-4 GM/100ML-% IV SOLN: 2.0000 g | INTRAVENOUS | Status: DC

## 2024-05-29 MED ORDER — ACETAMINOPHEN 10 MG/ML IV SOLN
INTRAVENOUS | Status: AC
  Filled 2024-05-29: qty 100

## 2024-05-29 MED ORDER — HYDROCODONE-ACETAMINOPHEN 7.5-325 MG PO TABS: 1.0000 | ORAL_TABLET | ORAL | Status: DC | PRN

## 2024-05-29 MED ORDER — LIDOCAINE HCL (CARDIAC) PF 100 MG/5ML IV SOSY
PREFILLED_SYRINGE | INTRAVENOUS | Status: DC | PRN
  Administered 2024-05-29: 100 mg via INTRAVENOUS

## 2024-05-29 MED ORDER — DEXAMETHASONE SODIUM PHOSPHATE 10 MG/ML IJ SOLN
INTRAMUSCULAR | Status: AC
  Filled 2024-05-29: qty 1

## 2024-05-29 MED ORDER — ACETAMINOPHEN 325 MG PO TABS: 325.0000 mg | ORAL_TABLET | Freq: Four times a day (QID) | ORAL | Status: DC | PRN

## 2024-05-29 MED ORDER — CEFAZOLIN SODIUM-DEXTROSE 2-4 GM/100ML-% IV SOLN
2.0000 g | Freq: Four times a day (QID) | INTRAVENOUS | Status: AC
  Administered 2024-05-29 – 2024-05-30 (×2): 2 g via INTRAVENOUS
  Filled 2024-05-29 (×2): qty 100

## 2024-05-29 MED ORDER — DIPHENHYDRAMINE HCL 12.5 MG/5ML PO ELIX: 12.5000 mg | ORAL_SOLUTION | ORAL | Status: DC | PRN

## 2024-05-29 MED ORDER — ONDANSETRON HCL 4 MG/2ML IJ SOLN
INTRAMUSCULAR | Status: DC | PRN
  Administered 2024-05-29: 4 mg via INTRAVENOUS

## 2024-05-29 MED ORDER — ISOPROPYL ALCOHOL 70 % SOLN
Status: DC | PRN
  Administered 2024-05-29: 1 via TOPICAL

## 2024-05-29 MED ORDER — TRANEXAMIC ACID-NACL 1000-0.7 MG/100ML-% IV SOLN
1000.0000 mg | INTRAVENOUS | Status: AC
  Administered 2024-05-29: 1000 mg via INTRAVENOUS
  Filled 2024-05-29: qty 100

## 2024-05-29 MED ORDER — PHENYLEPHRINE HCL-NACL 20-0.9 MG/250ML-% IV SOLN
INTRAVENOUS | Status: DC | PRN
  Administered 2024-05-29: 25 ug/min via INTRAVENOUS

## 2024-05-29 MED ORDER — ACETAMINOPHEN 500 MG PO TABS
1000.0000 mg | ORAL_TABLET | Freq: Once | ORAL | Status: DC
Start: 2024-05-29 — End: 2024-05-29

## 2024-05-29 MED ORDER — ONDANSETRON HCL 4 MG/2ML IJ SOLN: 4.0000 mg | Freq: Once | INTRAMUSCULAR | Status: DC | PRN

## 2024-05-29 MED ORDER — ONDANSETRON HCL 4 MG PO TABS: 4.0000 mg | ORAL_TABLET | Freq: Four times a day (QID) | ORAL | Status: DC | PRN

## 2024-05-29 SURGICAL SUPPLY — 47 items
BAG COUNTER SPONGE SURGICOUNT (BAG) IMPLANT
BAG ZIPLOCK 12X15 (MISCELLANEOUS) ×2 IMPLANT
BLADE SAW RECIPROCATING 77.5 (BLADE) IMPLANT
BLADE SAW SGTL 18X104X1.24 (BLADE) ×2 IMPLANT
CHLORAPREP W/TINT 26 (MISCELLANEOUS) ×2 IMPLANT
COVER PERINEAL POST (MISCELLANEOUS) ×1 IMPLANT
COVER SURGICAL LIGHT HANDLE (MISCELLANEOUS) ×1 IMPLANT
DERMABOND ADVANCED .7 DNX12 (GAUZE/BANDAGES/DRESSINGS) ×2 IMPLANT
DRAPE IMP U-DRAPE 54X76 (DRAPES) ×2 IMPLANT
DRAPE SHEET LG 3/4 BI-LAMINATE (DRAPES) ×6 IMPLANT
DRAPE STERI IOBAN 125X83 (DRAPES) ×1 IMPLANT
DRAPE U-SHAPE 47X51 STRL (DRAPES) ×2 IMPLANT
DRSG AQUACEL AG ADV 3.5X10 (GAUZE/BANDAGES/DRESSINGS) ×2 IMPLANT
ELECT REM PT RETURN 15FT ADLT (MISCELLANEOUS) ×2 IMPLANT
G7 VIT E NTRL LNR 36 SZG (Miscellaneous) IMPLANT
GAUZE SPONGE 4X4 12PLY STRL (GAUZE/BANDAGES/DRESSINGS) ×1 IMPLANT
GLOVE BIO SURGEON STRL SZ7 (GLOVE) ×2 IMPLANT
GLOVE BIO SURGEON STRL SZ8.5 (GLOVE) ×4 IMPLANT
GLOVE BIOGEL PI IND STRL 7.5 (GLOVE) ×2 IMPLANT
GLOVE BIOGEL PI IND STRL 8.5 (GLOVE) ×2 IMPLANT
GOWN SPEC L3 XXLG W/TWL (GOWN DISPOSABLE) ×2 IMPLANT
GOWN STRL REUS W/ TWL XL LVL3 (GOWN DISPOSABLE) ×2 IMPLANT
HEAD FEM MOD 36M STD (Miscellaneous) IMPLANT
HOLDER FOLEY CATH W/STRAP (MISCELLANEOUS) ×2 IMPLANT
HOOD PEEL AWAY T7 (MISCELLANEOUS) ×6 IMPLANT
KIT TURNOVER KIT A (KITS) ×2 IMPLANT
MANIFOLD NEPTUNE II (INSTRUMENTS) ×2 IMPLANT
MARKER SKIN DUAL TIP RULER LAB (MISCELLANEOUS) ×1 IMPLANT
NDL SAFETY ECLIPSE 18X1.5 (NEEDLE) ×2 IMPLANT
NDL SPNL 18GX3.5 QUINCKE PK (NEEDLE) ×1 IMPLANT
NEEDLE SPNL 18GX3.5 QUINCKE PK (NEEDLE) ×1 IMPLANT
PACK ANTERIOR HIP CUSTOM (KITS) ×2 IMPLANT
PENCIL SMOKE EVACUATOR (MISCELLANEOUS) ×1 IMPLANT
SEALER BIPOLAR AQUA 6.0 (INSTRUMENTS) ×2 IMPLANT
SET HNDPC FAN SPRY TIP SCT (DISPOSABLE) ×1 IMPLANT
SHELL ACET G7 4H 60 SZG (Shell) IMPLANT
SOLUTION PRONTOSAN WOUND 350ML (IRRIGATION / IRRIGATOR) ×2 IMPLANT
SPIKE FLUID TRANSFER (MISCELLANEOUS) ×2 IMPLANT
STEM FEM CMTLS 16X152 133D (Stem) IMPLANT
SUT MNCRL AB 3-0 PS2 18 (SUTURE) ×2 IMPLANT
SUT MON AB 2-0 CT1 36 (SUTURE) ×1 IMPLANT
SUT STRATAFIX 14 PDO 48 VLT (SUTURE) ×1 IMPLANT
SUT VIC AB 2-0 CT1 TAPERPNT 27 (SUTURE) IMPLANT
SYR 3ML LL SCALE MARK (SYRINGE) ×2 IMPLANT
TOWEL GREEN STERILE FF (TOWEL DISPOSABLE) ×2 IMPLANT
TRAY FOLEY MTR SLVR 16FR STAT (SET/KITS/TRAYS/PACK) IMPLANT
TUBE SUCTION HIGH CAP CLEAR NV (SUCTIONS) ×2 IMPLANT

## 2024-05-29 NOTE — Op Note (Signed)
 OPERATIVE REPORT  SURGEON: Redell Shoals, MD   ASSISTANT: Valery Potters, PA-C  PREOPERATIVE DIAGNOSIS: Displaced Right femoral neck fracture.   POSTOPERATIVE DIAGNOSIS: Displaced Right femoral neck fracture.   PROCEDURE: Right total hip arthroplasty, anterior approach.   IMPLANTS: Biomet Taperloc Reduced Distal stem, size 16 x 152 mm, high offset. Biomet G7 OsseoTi Cup, size 60 mm. Biomet Vivacit-E liner, size 36 mm, G, neutral. Biomet metal head ball, size 36 + 0 mm.  ANESTHESIA:  General  ANTIBIOTICS: 2g ancef .  ESTIMATED BLOOD LOSS:-200 mL    DRAINS: None.  COMPLICATIONS: None   CONDITION: PACU - hemodynamically stable.   BRIEF CLINICAL NOTE: Chris Burton is a 83 y.o. male with a displaced Right femoral neck fracture. The patient was admitted to the hospitalist service and underwent perioperative risk stratification and medical optimization. The risks, benefits, and alternatives to total hip arthroplasty were explained, and the patient elected to proceed.  PROCEDURE IN DETAIL: The patient was taken to the operating room and general anesthesia was induced on the hospital bed.  The patient was then positioned on the Hana table.  All bony prominences were well padded.  The hip was prepped and draped in the normal sterile surgical fashion.  A time-out was called verifying side and site of surgery. Antibiotics were given within 60 minutes of beginning the procedure.   Bikini incision was made, and the direct anterior approach to the hip was performed through the Hueter interval.  Lateral femoral circumflex vessels were treated with the Auqumantys. The anterior capsule was exposed and an inverted T capsulotomy was made.  Fracture hematoma was encountered and evacuated. The patient was found to have a comminuted Right subcapital femoral neck fracture.  I freshened the femoral neck cut with a saw.  I removed the femoral neck fragment.  A corkscrew was placed into the head and the head  was removed.  This was passed to the back table and was measured. The pubofemoral ligament was released subperiosteally to the lesser trochanter.  Acetabular exposure was achieved, and the pulvinar and labrum were excised. Sequential reaming of the acetabulum was then performed up to a size 59 mm reamer under direct visulization. A 60 mm cup was then opened and impacted into place at approximately 40 degrees of abduction and 20 degrees of anteversion. The final polyethylene liner was impacted into place and acetabular osteophytes were removed.    I then gained femoral exposure taking care to protect the abductors and greater trochanter.  This was performed using standard external rotation, extension, and adduction.  A cookie cutter was used to enter the femoral canal, and then the femoral canal finder was placed.  Sequential broaching was performed up to a size 16.  Calcar planer was used on the femoral neck remnant.  I placed a high offset neck and a trial head ball.  The hip was reduced.  Leg lengths and offset were checked fluoroscopically.  The hip was dislocated and trial components were removed.  The final implants were placed, and the hip was reduced.  Fluoroscopy was used to confirm component position and leg lengths.  At 90 degrees of external rotation and full extension, the hip was stable to an anterior directed force.   The wound was copiously irrigated with Prontosan solution and normal saline using pulse lavage.  Marcaine  solution was injected into the periarticular soft tissue.  The wound was closed in layers using #1 Stratafix for the fascia, 2-0 Vicryl for the subcutaneous fat, 2-0 Monocryl  for the deep dermal layer, 3-0 running Monocryl subcuticular stitch, and Dermabond for the skin.  Once the glue was fully dried, an Aquacell Ag dressing was applied.  The patient was transported to the recovery room in stable condition.  Sponge, needle, and instrument counts were correct at the end of the  case x2.  The patient tolerated the procedure well and there were no known complications.  Please note that a surgical assistant was a medical necessity for this procedure to perform it in a safe and expeditious manner. Assistant was necessary to provide appropriate retraction of vital neurovascular structures, to prevent femoral fracture, and to allow for anatomic placement of the prosthesis.
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
## 2024-05-29 NOTE — Discharge Instructions (Signed)

## 2024-05-29 NOTE — Anesthesia Procedure Notes (Signed)
 Procedure Name: Intubation Date/Time: 05/29/2024 2:10 PM  Performed by: Obadiah Reyes BROCKS, CRNAPre-anesthesia Checklist: Patient identified, Emergency Drugs available, Suction available and Patient being monitored Patient Re-evaluated:Patient Re-evaluated prior to induction Oxygen Delivery Method: Circle System Utilized Preoxygenation: Pre-oxygenation with 100% oxygen Induction Type: IV induction Ventilation: Mask ventilation without difficulty Laryngoscope Size: Miller and 2 Grade View: Grade I Tube type: Oral Tube size: 7.5 mm Number of attempts: 1 Airway Equipment and Method: Stylet and Oral airway Placement Confirmation: ETT inserted through vocal cords under direct vision, positive ETCO2 and breath sounds checked- equal and bilateral Secured at: 23 cm Tube secured with: Tape Dental Injury: Teeth and Oropharynx as per pre-operative assessment

## 2024-05-29 NOTE — Anesthesia Postprocedure Evaluation (Signed)
 Anesthesia Post Note  Patient: Chris Burton  Procedure(s) Performed: ARTHROPLASTY, HIP, TOTAL, ANTERIOR APPROACH (Right: Hip)     Patient location during evaluation: PACU Anesthesia Type: General Level of consciousness: awake and alert Pain management: pain level controlled Vital Signs Assessment: post-procedure vital signs reviewed and stable Respiratory status: spontaneous breathing, nonlabored ventilation and respiratory function stable Cardiovascular status: stable and blood pressure returned to baseline Anesthetic complications: no   No notable events documented.  Last Vitals:  Vitals:   05/29/24 1715 05/29/24 1728  BP: 115/72   Pulse: 69 72  Resp: 14 17  Temp:  (!) 36.1 C  SpO2: 93% 93%    Last Pain:  Vitals:   05/29/24 1728  TempSrc:   PainSc: 0-No pain                 Debby FORBES Like

## 2024-05-29 NOTE — Progress Notes (Signed)
   05/29/24 0000  BiPAP/CPAP/SIPAP  $ Non-Invasive Home Ventilator  Initial  $ Face Mask Medium Yes  BiPAP/CPAP/SIPAP Pt Type Adult  BiPAP/CPAP/SIPAP DREAMSTATIOND  Mask Type Full face mask  Dentures removed? Not applicable  Mask Size Medium  Set Rate 0 breaths/min  Respiratory Rate 18 breaths/min  Patient Home Machine No  Patient Home Mask No  Patient Home Tubing No  Auto Titrate Yes  Minimum cmH2O 5 cmH2O  Maximum cmH2O 20 cmH2O  Device Plugged into RED Power Outlet Yes
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
## 2024-05-29 NOTE — Anesthesia Preprocedure Evaluation (Addendum)
 Anesthesia Evaluation  Patient identified by MRN, date of birth, ID band Patient awake    Reviewed: Allergy & Precautions, NPO status , Patient's Chart, lab work & pertinent test results  History of Anesthesia Complications Negative for: history of anesthetic complications  Airway Mallampati: I       Dental  (+) Dental Advisory Given, Teeth Intact   Pulmonary former smoker   Pulmonary exam normal        Cardiovascular hypertension, (-) CAD and (-) Past MI (-) dysrhythmias (-) pacemaker Rhythm:Regular Rate:Normal     Neuro/Psych neg Seizures    GI/Hepatic hiatal hernia (Small on CT in 2021),GERD  Medicated,,  Endo/Other    Renal/GU Renal InsufficiencyRenal disease     Musculoskeletal Low Back Pain   Abdominal   Peds  Hematology   Anesthesia Other Findings   Reproductive/Obstetrics                              Anesthesia Physical Anesthesia Plan  ASA: 3  Anesthesia Plan: General   Post-op Pain Management:    Induction: Intravenous  PONV Risk Score and Plan: 1 and Ondansetron  and TIVA  Airway Management Planned: Oral ETT  Additional Equipment:   Intra-op Plan:   Post-operative Plan: Extubation in OR  Informed Consent:      Dental advisory given  Plan Discussed with: CRNA and Surgeon  Anesthesia Plan Comments: (Of note, methylparaben allergy. Plan for GETA and TIVA. )         Anesthesia Quick Evaluation
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
## 2024-05-29 NOTE — Transfer of Care (Signed)
 Immediate Anesthesia Transfer of Care Note  Patient: Chris Burton  Procedure(s) Performed: ARTHROPLASTY, HIP, TOTAL, ANTERIOR APPROACH (Right: Hip)  Patient Location: PACU  Anesthesia Type:General  Level of Consciousness: sedated and responds to stimulation  Airway & Oxygen Therapy: Patient Spontanous Breathing and Patient connected to face mask oxygen  Post-op Assessment: Report given to RN and Post -op Vital signs reviewed and stable  Post vital signs: Reviewed and stable  Last Vitals:  Vitals Value Taken Time  BP 100/60 05/29/24 16:32  Temp    Pulse 68 05/29/24 16:37  Resp    SpO2 94 % 05/29/24 16:37  Vitals shown include unfiled device data.  Last Pain:  Vitals:   05/29/24 1250  TempSrc:   PainSc: 2       Patients Stated Pain Goal: 2 (05/29/24 1100)  Complications: No notable events documented.

## 2024-05-30 ENCOUNTER — Other Ambulatory Visit (HOSPITAL_COMMUNITY): Payer: Self-pay

## 2024-05-30 ENCOUNTER — Encounter (HOSPITAL_COMMUNITY): Payer: Self-pay | Admitting: Orthopedic Surgery

## 2024-05-30 DIAGNOSIS — D62 Acute posthemorrhagic anemia: Secondary | ICD-10-CM | POA: Diagnosis not present

## 2024-05-30 DIAGNOSIS — S72001A Fracture of unspecified part of neck of right femur, initial encounter for closed fracture: Secondary | ICD-10-CM | POA: Diagnosis not present

## 2024-05-30 DIAGNOSIS — D696 Thrombocytopenia, unspecified: Secondary | ICD-10-CM

## 2024-05-30 DIAGNOSIS — D72829 Elevated white blood cell count, unspecified: Secondary | ICD-10-CM

## 2024-05-30 LAB — CBC WITH DIFFERENTIAL/PLATELET
Abs Immature Granulocytes: 0.04 K/uL (ref 0.00–0.07)
Basophils Absolute: 0 K/uL (ref 0.0–0.1)
Basophils Relative: 0 %
Eosinophils Absolute: 0 K/uL (ref 0.0–0.5)
Eosinophils Relative: 0 %
HCT: 34.7 % — ABNORMAL LOW (ref 39.0–52.0)
Hemoglobin: 11.5 g/dL — ABNORMAL LOW (ref 13.0–17.0)
Immature Granulocytes: 0 %
Lymphocytes Relative: 9 %
Lymphs Abs: 1 K/uL (ref 0.7–4.0)
MCH: 32 pg (ref 26.0–34.0)
MCHC: 33.1 g/dL (ref 30.0–36.0)
MCV: 96.7 fL (ref 80.0–100.0)
Monocytes Absolute: 0.9 K/uL (ref 0.1–1.0)
Monocytes Relative: 8 %
Neutro Abs: 8.9 K/uL — ABNORMAL HIGH (ref 1.7–7.7)
Neutrophils Relative %: 83 %
Platelets: 119 K/uL — ABNORMAL LOW (ref 150–400)
RBC: 3.59 MIL/uL — ABNORMAL LOW (ref 4.22–5.81)
RDW: 13 % (ref 11.5–15.5)
WBC: 10.8 K/uL — ABNORMAL HIGH (ref 4.0–10.5)
nRBC: 0 % (ref 0.0–0.2)

## 2024-05-30 LAB — COMPREHENSIVE METABOLIC PANEL WITH GFR
ALT: 13 U/L (ref 0–44)
AST: 28 U/L (ref 15–41)
Albumin: 3.3 g/dL — ABNORMAL LOW (ref 3.5–5.0)
Alkaline Phosphatase: 50 U/L (ref 38–126)
Anion gap: 12 (ref 5–15)
BUN: 20 mg/dL (ref 8–23)
CO2: 20 mmol/L — ABNORMAL LOW (ref 22–32)
Calcium: 8.3 mg/dL — ABNORMAL LOW (ref 8.9–10.3)
Chloride: 106 mmol/L (ref 98–111)
Creatinine, Ser: 1.8 mg/dL — ABNORMAL HIGH (ref 0.61–1.24)
GFR, Estimated: 37 mL/min — ABNORMAL LOW (ref >60)
Glucose, Bld: 147 mg/dL — ABNORMAL HIGH (ref 70–99)
Potassium: 4.7 mmol/L (ref 3.5–5.1)
Sodium: 138 mmol/L (ref 135–145)
Total Bilirubin: 0.4 mg/dL (ref 0.0–1.2)
Total Protein: 5.1 g/dL — ABNORMAL LOW (ref 6.5–8.1)

## 2024-05-30 LAB — MAGNESIUM: Magnesium: 1.7 mg/dL (ref 1.7–2.4)

## 2024-05-30 LAB — PHOSPHORUS: Phosphorus: 3.4 mg/dL (ref 2.5–4.6)

## 2024-05-30 MED ORDER — ASPIRIN 81 MG PO CHEW
81.0000 mg | CHEWABLE_TABLET | Freq: Two times a day (BID) | ORAL | 0 refills | Status: AC
  Filled 2024-05-30: qty 90, 45d supply, fill #0

## 2024-05-30 MED ORDER — SODIUM CHLORIDE 0.9 % IV SOLN: INTRAVENOUS | Status: AC

## 2024-05-30 MED ORDER — SODIUM CHLORIDE 0.9 % IV BOLUS
500.0000 mL | Freq: Once | INTRAVENOUS | Status: AC
Start: 2024-05-30 — End: 2024-05-30
  Administered 2024-05-30: 500 mL via INTRAVENOUS

## 2024-05-30 MED ORDER — HYDROCODONE-ACETAMINOPHEN 5-325 MG PO TABS
1.0000 | ORAL_TABLET | ORAL | 0 refills | Status: AC | PRN
  Filled 2024-05-30: qty 42, 7d supply, fill #0

## 2024-05-30 MED ORDER — MAGNESIUM SULFATE 2 GM/50ML IV SOLN
2.0000 g | Freq: Once | INTRAVENOUS | Status: AC
  Administered 2024-05-30: 2 g via INTRAVENOUS
  Filled 2024-05-30 (×2): qty 50

## 2024-05-30 NOTE — Plan of Care (Signed)
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
## 2024-05-30 NOTE — TOC Progression Note (Signed)
 Transition of Care [facility]) - Progression Note    Patient Details  Name: Chris Burton MRN: [id number] Date of Birth: [birth year]  [facility] CM/SW Contact  Alfonse JONELLE Rex, RN Phone Number: 05/30/2024, 2:15 PM  Clinical Narrative:   Met with patient at bedside to introduce role of Inpt CM and review for dc planning, PT recommendation for [facility] PT, pt agreeable, no preference. Patient reports he has a RW at home that belonged to his wife, would like a new one of his own . Referral sent to [employer], rep-Mitch, referral accepted, awaiting MD order. Patient reports he resides with his spouse and feels safe going home, confirmed his spouse or friend will provide transportation at discharge.     Expected Discharge Plan: Home w Home Health Services Barriers to Discharge: Continued Medical Work up               Expected Discharge Plan and Services       Living arrangements for the past 2 months: Single Family Home                   DME Agency: [employer]       HH Arranged: PT           Social Drivers of Health (SDOH) Interventions SDOH Screenings   Food Insecurity: No Food Insecurity (05/28/2024)  Housing: Low Risk  (05/28/2024)  Transportation Needs: No Transportation Needs (05/28/2024)  Utilities: Not At Risk (05/28/2024)  Financial Resource Strain: Low Risk  (11/03/2023)   Received from [facility]  Physical Activity: Sufficiently Active (11/03/2023)   Received from [facility]  Social Connections: Moderately Integrated (05/28/2024)  Stress: Stress Concern Present (11/03/2023)   Received from [facility]  Tobacco Use: Medium Risk (05/29/2024)    Readmission Risk Interventions     No data to display
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
## 2024-05-30 NOTE — Progress Notes (Signed)
 MEWS Progress Note  Patient Details Name: Chris Burton MRN: [id number] DOB: May 02, 1941 Today's Date: 05/30/2024   MEWS Flowsheet Documentation:  Assess: MEWS Score Temp: 98.6 F (37 C) BP: (!) 86/59 MAP (mmHg): 69 Pulse Rate: (!) 102 ECG Heart Rate: 91 Resp: 16 Level of Consciousness: Alert SpO2: 93 % O2 Device: Room Air Patient Activity (if Appropriate): In bed O2 Flow Rate (L/min): 0 L/min Assess: MEWS Score MEWS Temp: 0 MEWS Systolic: 1 MEWS Pulse: 1 MEWS RR: 0 MEWS Chris: 0 MEWS Score: 2 MEWS Score Color: Yellow Assess: SIRS CRITERIA SIRS Temperature : 0 SIRS Respirations : 0 SIRS Pulse: 1 SIRS WBC: 0 SIRS Score Sum : 1 SIRS Temperature : 0 SIRS Pulse: 1 SIRS Respirations : 0 SIRS WBC: 0 SIRS Score Sum : 1 Assess: if the MEWS score is Yellow or Red Were vital signs accurate and taken at a resting state?: Yes Does the patient meet 2 or more of the SIRS criteria?: No Notify: Charge Nurse/RN Name of Charge Nurse/RN Notified: Lorene Klimas,RN Provider Notification Provider Name/Title: Sherrill Date Provider Notified: 05/30/24 Time Provider Notified: 1005 Method of Notification: Call (secure chat) Notification Reason: Other (Comment) (Yellow MEWS) Provider response: See new orders Date of Provider Response: 05/30/24 Time of Provider Response: 1006      Leory Allinson E Trinitey Roache 05/30/2024, 10:52 AM

## 2024-05-31 DIAGNOSIS — D696 Thrombocytopenia, unspecified: Secondary | ICD-10-CM | POA: Diagnosis not present

## 2024-05-31 DIAGNOSIS — D62 Acute posthemorrhagic anemia: Secondary | ICD-10-CM | POA: Diagnosis not present

## 2024-05-31 DIAGNOSIS — S72001A Fracture of unspecified part of neck of right femur, initial encounter for closed fracture: Secondary | ICD-10-CM | POA: Diagnosis not present

## 2024-05-31 DIAGNOSIS — D72829 Elevated white blood cell count, unspecified: Secondary | ICD-10-CM | POA: Diagnosis not present

## 2024-05-31 LAB — COMPREHENSIVE METABOLIC PANEL WITH GFR
ALT: 7 U/L (ref 0–44)
AST: 26 U/L (ref 15–41)
Albumin: 3 g/dL — ABNORMAL LOW (ref 3.5–5.0)
Alkaline Phosphatase: 50 U/L (ref 38–126)
Anion gap: 10 (ref 5–15)
BUN: 26 mg/dL — ABNORMAL HIGH (ref 8–23)
CO2: 20 mmol/L — ABNORMAL LOW (ref 22–32)
Calcium: 8 mg/dL — ABNORMAL LOW (ref 8.9–10.3)
Chloride: 109 mmol/L (ref 98–111)
Creatinine, Ser: 1.76 mg/dL — ABNORMAL HIGH (ref 0.61–1.24)
GFR, Estimated: 38 mL/min — ABNORMAL LOW (ref >60)
Glucose, Bld: 115 mg/dL — ABNORMAL HIGH (ref 70–99)
Potassium: 4.5 mmol/L (ref 3.5–5.1)
Sodium: 138 mmol/L (ref 135–145)
Total Bilirubin: 0.4 mg/dL (ref 0.0–1.2)
Total Protein: 4.9 g/dL — ABNORMAL LOW (ref 6.5–8.1)

## 2024-05-31 LAB — CBC WITH DIFFERENTIAL/PLATELET
Abs Immature Granulocytes: 0.06 K/uL (ref 0.00–0.07)
Basophils Absolute: 0 K/uL (ref 0.0–0.1)
Basophils Relative: 0 %
Eosinophils Absolute: 0.3 K/uL (ref 0.0–0.5)
Eosinophils Relative: 3 %
HCT: 31.4 % — ABNORMAL LOW (ref 39.0–52.0)
Hemoglobin: 10.3 g/dL — ABNORMAL LOW (ref 13.0–17.0)
Immature Granulocytes: 1 %
Lymphocytes Relative: 19 %
Lymphs Abs: 1.6 K/uL (ref 0.7–4.0)
MCH: 32 pg (ref 26.0–34.0)
MCHC: 32.8 g/dL (ref 30.0–36.0)
MCV: 97.5 fL (ref 80.0–100.0)
Monocytes Absolute: 1 K/uL (ref 0.1–1.0)
Monocytes Relative: 12 %
Neutro Abs: 5.5 K/uL (ref 1.7–7.7)
Neutrophils Relative %: 65 %
Platelets: 110 K/uL — ABNORMAL LOW (ref 150–400)
RBC: 3.22 MIL/uL — ABNORMAL LOW (ref 4.22–5.81)
RDW: 13.2 % (ref 11.5–15.5)
WBC: 8.5 K/uL (ref 4.0–10.5)
nRBC: 0 % (ref 0.0–0.2)

## 2024-05-31 LAB — PHOSPHORUS: Phosphorus: 1.9 mg/dL — ABNORMAL LOW (ref 2.5–4.6)

## 2024-05-31 LAB — MAGNESIUM: Magnesium: 2.1 mg/dL (ref 1.7–2.4)

## 2024-05-31 MED ORDER — K PHOS MONO-SOD PHOS DI & MONO 155-852-130 MG PO TABS
500.0000 mg | ORAL_TABLET | Freq: Two times a day (BID) | ORAL | Status: AC
Start: 2024-05-31 — End: 2024-06-01
  Administered 2024-05-31 – 2024-06-01 (×2): 500 mg via ORAL
  Filled 2024-05-31 (×3): qty 2

## 2024-05-31 NOTE — Plan of Care (Signed)
°  Problem: Education: °Goal: Knowledge of General Education information will improve °Description: Including pain rating scale, medication(s)/side effects and non-pharmacologic comfort measures °Outcome: Progressing °  °Problem: Clinical Measurements: °Goal: Cardiovascular complication will be avoided °Outcome: Progressing °  °Problem: Activity: °Goal: Risk for activity intolerance will decrease °Outcome: Progressing °  °

## 2024-05-31 NOTE — Progress Notes (Signed)
    Subjective:  Patient reports pain as mild to moderate.  Denies N/V/CP/SOB/Abd pain. He reports some thigh soreness, overall doing well.  Voiding without difficulty.  He is hopeful for d/c home today.   Objective:   VITALS:   Vitals:   05/30/24 2137 05/30/24 2333 05/30/24 2347 05/31/24 0357  BP:  116/70 115/68 108/66  Pulse:  (!) 102 (!) 105 92  Resp:  19 18 17   Temp:  (!) 100.6 F (38.1 C) 99.1 F (37.3 C) 99.6 F (37.6 C)  TempSrc:  Oral Oral Oral  SpO2: 91% 94% 95% 97%  Weight:      Height:        NAD Neurologically intact ABD soft Neurovascular intact Sensation intact distally Intact pulses distally Dorsiflexion/Plantar flexion intact Incision: dressing C/D/I No cellulitis present Compartment soft   Lab Results  Component Value Date   WBC 8.5 05/31/2024   HGB 10.3 (L) 05/31/2024   HCT 31.4 (L) 05/31/2024   MCV 97.5 05/31/2024   PLT 110 (L) 05/31/2024   BMET    Component Value Date/Time   NA 138 05/31/2024 0334   K 4.5 05/31/2024 0334   CL 109 05/31/2024 0334   CO2 20 (L) 05/31/2024 0334   GLUCOSE 115 (H) 05/31/2024 0334   GLUCOSE 95 07/27/2006 1103   BUN 26 (H) 05/31/2024 0334   CREATININE 1.76 (H) 05/31/2024 0334   CALCIUM 8.0 (L) 05/31/2024 0334   GFRNONAA 38 (L) 05/31/2024 0334     Assessment/Plan: 2 Days Post-Op   Principal Problem:   Closed right hip fracture (HCC) Active Problems:   HYPERLIPIDEMIA   PROSTATE CANCER, HX OF   CKD (chronic kidney disease) stage 3, GFR 30-59 ml/min (HCC)   WBAT with walker DVT ppx: Aspirin, SCDs, TEDS PO pain control PT/OT: He ambulated 260 and 140 feet with PT yesterday.  Dispo:  - Patient under care of the medical team disposition per their recommendation. Patient hopeful for d/c home looks like also plan for HHPT. Medications sent to pharmacy.  - Follow-up in office in 2 weeks for repeat evaluation.    Valery GORMAN Potters 05/31/2024, 8:25 AM   [facility]  [place] [address]  [address], [place], [place] [address] Phone: [phone] [email or web address] Facebook  [employer]

## 2024-05-31 NOTE — Progress Notes (Signed)
   05/31/24 2101  BiPAP/CPAP/SIPAP  $ Non-Invasive Home Ventilator  Subsequent  BiPAP/CPAP/SIPAP Pt Type Adult  BiPAP/CPAP/SIPAP Resmed  Reason BIPAP/CPAP not in use Non-compliant  Patient Home Machine No  Patient Home Mask No  Patient Home Tubing No
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
## 2024-05-31 NOTE — Plan of Care (Signed)

## 2024-06-01 ENCOUNTER — Other Ambulatory Visit (HOSPITAL_COMMUNITY): Payer: Self-pay

## 2024-06-01 DIAGNOSIS — E782 Mixed hyperlipidemia: Secondary | ICD-10-CM | POA: Diagnosis not present

## 2024-06-01 DIAGNOSIS — S72001A Fracture of unspecified part of neck of right femur, initial encounter for closed fracture: Secondary | ICD-10-CM | POA: Diagnosis not present

## 2024-06-01 DIAGNOSIS — E875 Hyperkalemia: Secondary | ICD-10-CM

## 2024-06-01 DIAGNOSIS — N1832 Chronic kidney disease, stage 3b: Secondary | ICD-10-CM | POA: Diagnosis not present

## 2024-06-01 DIAGNOSIS — Z8546 Personal history of malignant neoplasm of prostate: Secondary | ICD-10-CM | POA: Diagnosis not present

## 2024-06-01 LAB — CBC WITH DIFFERENTIAL/PLATELET
Abs Immature Granulocytes: 0.14 K/uL — ABNORMAL HIGH (ref 0.00–0.07)
Basophils Absolute: 0 K/uL (ref 0.0–0.1)
Basophils Relative: 0 %
Eosinophils Absolute: 0.2 K/uL (ref 0.0–0.5)
Eosinophils Relative: 2 %
HCT: 31.7 % — ABNORMAL LOW (ref 39.0–52.0)
Hemoglobin: 10 g/dL — ABNORMAL LOW (ref 13.0–17.0)
Immature Granulocytes: 2 %
Lymphocytes Relative: 18 %
Lymphs Abs: 1.6 K/uL (ref 0.7–4.0)
MCH: 31 pg (ref 26.0–34.0)
MCHC: 31.5 g/dL (ref 30.0–36.0)
MCV: 98.1 fL (ref 80.0–100.0)
Monocytes Absolute: 1 K/uL (ref 0.1–1.0)
Monocytes Relative: 12 %
Neutro Abs: 5.9 K/uL (ref 1.7–7.7)
Neutrophils Relative %: 66 %
Platelets: 126 K/uL — ABNORMAL LOW (ref 150–400)
RBC: 3.23 MIL/uL — ABNORMAL LOW (ref 4.22–5.81)
RDW: 12.8 % (ref 11.5–15.5)
WBC: 9 K/uL (ref 4.0–10.5)
nRBC: 0 % (ref 0.0–0.2)

## 2024-06-01 LAB — COMPREHENSIVE METABOLIC PANEL WITH GFR
ALT: 8 U/L (ref 0–44)
AST: 26 U/L (ref 15–41)
Albumin: 3.2 g/dL — ABNORMAL LOW (ref 3.5–5.0)
Alkaline Phosphatase: 58 U/L (ref 38–126)
Anion gap: 10 (ref 5–15)
BUN: 25 mg/dL — ABNORMAL HIGH (ref 8–23)
CO2: 22 mmol/L (ref 22–32)
Calcium: 8.5 mg/dL — ABNORMAL LOW (ref 8.9–10.3)
Chloride: 106 mmol/L (ref 98–111)
Creatinine, Ser: 1.82 mg/dL — ABNORMAL HIGH (ref 0.61–1.24)
GFR, Estimated: 36 mL/min — ABNORMAL LOW (ref >60)
Glucose, Bld: 129 mg/dL — ABNORMAL HIGH (ref 70–99)
Potassium: 5.3 mmol/L — ABNORMAL HIGH (ref 3.5–5.1)
Sodium: 138 mmol/L (ref 135–145)
Total Bilirubin: 0.5 mg/dL (ref 0.0–1.2)
Total Protein: 5.2 g/dL — ABNORMAL LOW (ref 6.5–8.1)

## 2024-06-01 LAB — MAGNESIUM: Magnesium: 2.2 mg/dL (ref 1.7–2.4)

## 2024-06-01 LAB — PHOSPHORUS: Phosphorus: 3.2 mg/dL (ref 2.5–4.6)

## 2024-06-01 MED ORDER — ONDANSETRON HCL 4 MG PO TABS
4.0000 mg | ORAL_TABLET | Freq: Four times a day (QID) | ORAL | 0 refills | Status: AC | PRN
  Filled 2024-06-01: qty 20, 5d supply, fill #0

## 2024-06-01 MED ORDER — POLYETHYLENE GLYCOL 3350 17 GM/SCOOP PO POWD
17.0000 g | Freq: Every day | ORAL | 0 refills | Status: AC | PRN
  Filled 2024-06-01: qty 238, 14d supply, fill #0

## 2024-06-01 MED ORDER — ADULT MULTIVITAMIN W/MINERALS CH
1.0000 | ORAL_TABLET | Freq: Every day | ORAL | 0 refills | Status: AC
  Filled 2024-06-01: qty 30, 30d supply, fill #0

## 2024-06-01 MED ORDER — ENSURE PLUS HIGH PROTEIN PO LIQD
237.0000 mL | Freq: Two times a day (BID) | ORAL | 0 refills | Status: AC
  Filled 2024-06-01: qty 2370, 5d supply, fill #0

## 2024-06-01 MED ORDER — SODIUM ZIRCONIUM CYCLOSILICATE 10 G PO PACK
10.0000 g | PACK | Freq: Once | ORAL | Status: AC
  Administered 2024-06-01: 10 g via ORAL
  Filled 2024-06-01: qty 1

## 2024-06-01 MED ORDER — ACETAMINOPHEN 325 MG PO TABS
325.0000 mg | ORAL_TABLET | Freq: Four times a day (QID) | ORAL | 0 refills | Status: AC | PRN
  Filled 2024-06-01: qty 20, 3d supply, fill #0

## 2024-06-01 MED ORDER — SENNA 8.6 MG PO TABS
1.0000 | ORAL_TABLET | Freq: Two times a day (BID) | ORAL | 0 refills | Status: AC
  Filled 2024-06-01: qty 120, 60d supply, fill #0

## 2024-06-01 NOTE — Progress Notes (Signed)
 Patient ID: Chris Burton, male   DOB: February 04, 1941, 83 y.o.   MRN: [id number] Subjective: 3 Days Post-Op Procedure(s) (LRB): ARTHROPLASTY, HIP, TOTAL, ANTERIOR APPROACH (Right)    Patient reports pain as mild to moderate depending on activity.  No negative events however.  Pleased with his current state and appreciative.  Objective:   VITALS:   Vitals:   05/31/24 2310 06/01/24 0606  BP:  126/77  Pulse: (!) 110 94  Resp: 16 18  Temp:  98.7 F (37.1 C)  SpO2: 96% 96%    Neurovascular intact Incision: dressing C/D/I  LABS Recent Labs    05/30/24 0339 05/31/24 0334 06/01/24 0338  HGB 11.5* 10.3* 10.0*  HCT 34.7* 31.4* 31.7*  WBC 10.8* 8.5 9.0  PLT 119* 110* 126*    Recent Labs    05/30/24 0339 05/31/24 0334 06/01/24 0338  NA 138 138 138  K 4.7 4.5 5.3*  BUN 20 26* 25*  CREATININE 1.80* 1.76* 1.82*  GLUCOSE 147* 115* 129*    No results for input(s): LABPT, INR in the last 72 hours.   Assessment/Plan: 3 Days Post-Op Procedure(s) (LRB): ARTHROPLASTY, HIP, TOTAL, ANTERIOR APPROACH (Right)   Up with therapy D/C to home once cleared by PT RTC to see [facility] in 2 weeks as noted
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
## 2024-06-01 NOTE — TOC Transition Note (Signed)
 Transition of Care [facility]) - Discharge Note   Patient Details  Name: Chris Burton MRN: [id number] Date of Birth: 05-Feb-1941  Transition of Care [facility]) CM/SW Contact:  Heather DELENA Saltness, LCSW Phone Number: 06/01/2024, 2:44 PM   Clinical Narrative:    Pt discharging home today. HH PT/OT set up with [facility] RW ordered through [employer], spoke to Aida who reports RW will be delivered to bedside. Pt's son to pick pt up at 5pm to transport home. No further TOC needs at this time.   Final next level of care: Home w Home Health Services Barriers to Discharge: Barriers Resolved   Patient Goals and CMS Choice Patient states their goals for this hospitalization and ongoing recovery are:: return home CMS Medicare.gov Compare Post Acute Care list provided to:: Patient Choice offered to / list presented to : Patient      Discharge Placement  Home              Patient to be transferred to facility by: Son Name of family member notified: Patient Patient and family notified of of transfer: 06/01/24  Discharge Plan and Services Additional resources added to the After Visit Summary for  [place]                DME Arranged: Vannie rolling DME Agency: [employer] Date DME Agency Contacted: 06/01/24 Time DME Agency Contacted: [phone] Representative spoke with at DME Agency: Aida HH Arranged: PT, OT HH Agency: [facility] Date [facility] Agency Contacted: 06/01/24 Time HH Agency Contacted: [phone] Representative spoke with at [facility] Agency: Cindie  Social Drivers of Health (SDOH) Interventions SDOH Screenings   Food Insecurity: No Food Insecurity (05/28/2024)  Housing: Low Risk  (05/28/2024)  Transportation Needs: No Transportation Needs (05/28/2024)  Utilities: Not At Risk (05/28/2024)  Financial Resource Strain: Low Risk  (11/03/2023)   Received from [facility]  Physical Activity: Sufficiently Active (11/03/2023)   Received from [facility]  Social Connections: Moderately Integrated  (05/28/2024)  Stress: Stress Concern Present (11/03/2023)   Received from [facility]  Tobacco Use: Medium Risk (05/29/2024)     Readmission Risk Interventions     No data to display          Signed: Heather Saltness, MSW, LCSW Clinical Social Worker Inpatient Care Management 06/01/2024 2:45 PM
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
## 2024-06-01 NOTE — Progress Notes (Signed)
 Discharge meds in a secure bag delivered to pt in room by this RN. Pt's wife to take meds home if pt does not discharge home tonight. Per primary nurse pt's son is not available tonight & pt has pain concerns, discharge may be moved to 10/5

## 2024-06-01 NOTE — Progress Notes (Addendum)
 Pt being DC. Pt states son will be here by 5PM. At 55 Wife informed nurse that son is not able to make it here tonight. He still in [place] and she can't take care of him at home. Notified MD and AC.

## 2024-06-02 ENCOUNTER — Other Ambulatory Visit (HOSPITAL_COMMUNITY): Payer: Self-pay

## 2024-06-02 DIAGNOSIS — Z8546 Personal history of malignant neoplasm of prostate: Secondary | ICD-10-CM | POA: Diagnosis not present

## 2024-06-02 DIAGNOSIS — S72001A Fracture of unspecified part of neck of right femur, initial encounter for closed fracture: Secondary | ICD-10-CM | POA: Diagnosis not present

## 2024-06-02 DIAGNOSIS — E782 Mixed hyperlipidemia: Secondary | ICD-10-CM | POA: Diagnosis not present

## 2024-06-02 DIAGNOSIS — N1832 Chronic kidney disease, stage 3b: Secondary | ICD-10-CM | POA: Diagnosis not present

## 2024-06-02 NOTE — TOC Transition Note (Signed)
 Transition of Care [facility]) - Discharge Note   Patient Details  Name: Chris Burton MRN: [id number] Date of Birth: 11/17/1940  [facility]) CM/SW Contact:  Sonda Manuella Quill, RN Phone Number: 06/02/2024, 8:48 AM   Clinical Narrative:    D/C orders received; HHPT/OT previously arranged w/ [facility]; Cory at agency notified; no IP CM needs.   Final next level of care: Home w Home Health Services Barriers to Discharge: No Barriers Identified   Patient Goals and CMS Choice Patient states their goals for this hospitalization and ongoing recovery are:: return home CMS Medicare.gov Compare Post Acute Care list provided to:: Patient Choice offered to / list presented to : Patient      Discharge Placement                Patient to be transferred to facility by: Son Name of family member notified: Patient Patient and family notified of of transfer: 06/01/24  Discharge Plan and Services Additional resources added to the After Visit Summary for                  DME Arranged: Walker rolling DME Agency: [employer] Date DME Agency Contacted: 06/01/24 Time DME Agency Contacted: [phone] Representative spoke with at DME Agency: Aida HH Arranged: PT, OT HH Agency: [facility] Date [facility] Agency Contacted: 06/01/24 Time HH Agency Contacted: [phone] Representative spoke with at [facility] Agency: Cindie  Social Drivers of Health (SDOH) Interventions SDOH Screenings   Food Insecurity: No Food Insecurity (05/28/2024)  Housing: Low Risk  (05/28/2024)  Transportation Needs: No Transportation Needs (05/28/2024)  Utilities: Not At Risk (05/28/2024)  Financial Resource Strain: Low Risk  (11/03/2023)   Received from [facility]  Physical Activity: Sufficiently Active (11/03/2023)   Received from [facility]  Social Connections: Moderately Integrated (05/28/2024)  Stress: Stress Concern Present (11/03/2023)   Received from [facility]  Tobacco Use: Medium Risk (05/29/2024)      Readmission Risk Interventions     No data to display
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
## 2024-06-02 NOTE — Plan of Care (Signed)
Discharge instructions given to the patient including medications and follow ups.

## 2024-06-02 NOTE — Plan of Care (Signed)
  Problem: Education: Goal: Knowledge of General Education information will improve Description: Including pain rating scale, medication(s)/side effects and non-pharmacologic comfort measures Outcome: Adequate for Discharge   Problem: Health Behavior/Discharge Planning: Goal: Ability to manage health-related needs will improve Outcome: Progressing   Problem: Clinical Measurements: Goal: Ability to maintain clinical measurements within normal limits will improve Outcome: Completed/Met Goal: Will remain free from infection Outcome: Progressing Goal: Diagnostic test results will improve Outcome: Adequate for Discharge Goal: Respiratory complications will improve Outcome: Progressing Goal: Cardiovascular complication will be avoided Outcome: Progressing   Problem: Activity: Goal: Risk for activity intolerance will decrease Outcome: Adequate for Discharge   Problem: Nutrition: Goal: Adequate nutrition will be maintained Outcome: Adequate for Discharge   Problem: Coping: Goal: Level of anxiety will decrease Outcome: Progressing   Problem: Elimination: Goal: Will not experience complications related to bowel motility Outcome: Completed/Met Goal: Will not experience complications related to urinary retention Outcome: Completed/Met   Problem: Pain Managment: Goal: General experience of comfort will improve and/or be controlled Outcome: Progressing   Problem: Safety: Goal: Ability to remain free from injury will improve Outcome: Progressing   Problem: Skin Integrity: Goal: Risk for impaired skin integrity will decrease Outcome: Progressing   Problem: Education: Goal: Knowledge of the prescribed therapeutic regimen will improve Outcome: Progressing Goal: Understanding of discharge needs will improve Outcome: Progressing Goal: Individualized Educational Video(s) Outcome: Completed/Met   Problem: Activity: Goal: Ability to avoid complications of mobility impairment will  improve Outcome: Progressing Goal: Ability to tolerate increased activity will improve Outcome: Adequate for Discharge   Problem: Clinical Measurements: Goal: Postoperative complications will be avoided or minimized Outcome: Progressing   Problem: Pain Management: Goal: Pain level will decrease with appropriate interventions Outcome: Progressing   Problem: Skin Integrity: Goal: Will show signs of wound healing Outcome: Progressing
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
## 2024-06-03 ENCOUNTER — Other Ambulatory Visit (HOSPITAL_COMMUNITY): Payer: Self-pay

## 2024-06-17 ENCOUNTER — Other Ambulatory Visit: Payer: Self-pay | Admitting: Urology

## 2024-06-21 NOTE — Progress Notes (Addendum)
 COVID Vaccine Completed: yes  Date of COVID positive in last 90 days:  PCP - Alm Bilis, MD Cardiologist -   Chest x-ray - 05/28/24 Epic EKG - 06/03/24 Epic Stress Test - N/A ECHO - N/A Cardiac Cath -  Pacemaker/ICD device last checked:N/A Spinal Cord Stimulator:N/A  Bowel Prep - N/A  Sleep Study - yes CPAP - no per pt choice- sleeping in lazy boy  Fasting Blood Sugar - N/A Checks Blood Sugar _____ times a day  Last dose of GLP1 agonist-  N/A GLP1 instructions:  Do not take after     Last dose of SGLT-2 inhibitors-  N/A SGLT-2 instructions:  Do not take after     Blood Thinner Instructions: N/A Last dose:   Time: Aspirin Instructions: ASA  81, hold 3 days Last Dose:  Activity level: Can go up a flight of stairs and perform activities of daily living without stopping and without symptoms of chest pain or shortness of breath. Has cane PRN  Anesthesia review: N/A  Patient denies shortness of breath, fever, cough and chest pain at PAT appointment  Patient verbalized understanding of instructions that were given to them at the PAT appointment. Patient was also instructed that they will need to review over the PAT instructions again at home before surgery.

## 2024-06-21 NOTE — Patient Instructions (Addendum)
 SURGICAL WAITING ROOM VISITATION  Patients having surgery or a procedure may have no more than 2 support people in the waiting area - these visitors may rotate.    Children under the age of 74 must have an adult with them who is not the patient.  Visitors with respiratory illnesses are discouraged from visiting and should remain at home.  If the patient needs to stay at the hospital during part of their recovery, the visitor guidelines for inpatient rooms apply. Pre-op nurse will coordinate an appropriate time for 1 support person to accompany patient in pre-op.  This support person may not rotate.    Please refer to the [facility] website for the visitor guidelines for Inpatients (after your surgery is over and you are in a regular room).    Your procedure is scheduled on: 06/27/24   Report to [facility] Main Entrance    Report to admitting at 8:10 AM   Call this number if you have problems the morning of surgery [phone]   Do not eat food or drink liquids :After Midnight.         If you have questions, please contact your surgeon's office.   FOLLOW BOWEL PREP AND ANY ADDITIONAL PRE OP INSTRUCTIONS YOU RECEIVED FROM YOUR SURGEON'S OFFICE!!!     Oral Hygiene is also important to reduce your risk of infection.                                    Remember - BRUSH YOUR TEETH THE MORNING OF SURGERY WITH YOUR REGULAR TOOTHPASTE  DENTURES WILL BE REMOVED PRIOR TO SURGERY PLEASE DO NOT APPLY Poly grip OR ADHESIVES!!!   Stop all vitamins and herbal supplements 7 days before surgery.   Take these medicines the morning of surgery with A SIP OF WATER: Tylenol , Nexium             You may not have any metal on your body including jewelry, and body piercing             Do not wear lotions, powders, cologne, or deodorant              Men may shave face and neck.   Do not bring valuables to the hospital. [facility] IS NOT             RESPONSIBLE   FOR  VALUABLES.   Contacts, glasses, dentures or bridgework may not be worn into surgery.  DO NOT BRING YOUR HOME MEDICATIONS TO THE HOSPITAL. PHARMACY WILL DISPENSE MEDICATIONS LISTED ON YOUR MEDICATION LIST TO YOU DURING YOUR ADMISSION IN THE HOSPITAL!    Patients discharged on the day of surgery will not be allowed to drive home.  Someone NEEDS to stay with you for the first 24 hours after anesthesia.   Special Instructions: Bring a copy of your healthcare power of attorney and living will documents the day of surgery if you haven't scanned them before.              Please read over the following fact sheets you were given: IF YOU HAVE QUESTIONS ABOUT YOUR PRE-OP INSTRUCTIONS PLEASE CALL [phone].   If you received a COVID test during your pre-op visit  it is requested that you wear a mask when out in public, stay away from anyone that may not be feeling well and notify your surgeon if you develop symptoms. If  you test positive for Covid or have been in contact with anyone that has tested positive in the last 10 days please notify you surgeon.    [facility] - Preparing for Surgery Before surgery, you can play an important role.  Because skin is not sterile, your skin needs to be as free of germs as possible.  You can reduce the number of germs on your skin by washing with CHG (chlorahexidine gluconate) soap before surgery.  CHG is an antiseptic cleaner which kills germs and bonds with the skin to continue killing germs even after washing. Please DO NOT use if you have an allergy to CHG or antibacterial soaps.  If your skin becomes reddened/irritated stop using the CHG and inform your nurse when you arrive at Short Stay. Do not shave (including legs and underarms) for at least 48 hours prior to the first CHG shower.  You may shave your face/neck.  Please follow these instructions carefully:  1.  Shower with CHG Soap the night before surgery and the morning of surgery.  2.  If you  choose to wash your hair, wash your hair first as usual with your normal  shampoo.  3.  After you shampoo, rinse your hair and body thoroughly to remove the shampoo.                             4.  Use CHG as you would any other liquid soap.  You can apply chg directly to the skin and wash.  Gently with a scrungie or clean washcloth.  5.  Apply the CHG Soap to your body ONLY FROM THE NECK DOWN.   Do   not use on face/ open                           Wound or open sores. Avoid contact with eyes, ears mouth and   genitals (private parts).                       Wash face,  Genitals (private parts) with your normal soap.             6.  Wash thoroughly, paying special attention to the area where your    surgery  will be performed.  7.  Thoroughly rinse your body with warm water from the neck down.  8.  DO NOT shower/wash with your normal soap after using and rinsing off the CHG Soap.                9.  Pat yourself dry with a clean towel.            10.  Wear clean pajamas.            11.  Place clean sheets on your bed the night of your first shower and do not  sleep with pets. Day of Surgery : Do not apply any CHG, lotions/deodorants the morning of surgery.  Please wear clean clothes to the hospital/surgery center.  FAILURE TO FOLLOW THESE INSTRUCTIONS MAY RESULT IN THE CANCELLATION OF YOUR SURGERY  PATIENT SIGNATURE_________________________________  NURSE SIGNATURE__________________________________  ________________________________________________________________________

## 2024-06-24 ENCOUNTER — Encounter (HOSPITAL_COMMUNITY)
Admission: RE | Admit: 2024-06-24 | Discharge: 2024-06-24 | Disposition: A | Discharge status: Home / Self Care | Source: Ambulatory Visit | Attending: Urology | Admitting: Urology

## 2024-06-24 ENCOUNTER — Other Ambulatory Visit: Payer: Self-pay

## 2024-06-24 ENCOUNTER — Encounter (HOSPITAL_COMMUNITY): Payer: Self-pay

## 2024-06-24 VITALS — BP 114/76 | HR 99 | Temp 98.1°F | Resp 16 | Ht 70.5 in | Wt 168.0 lb

## 2024-06-24 DIAGNOSIS — Z01812 Encounter for preprocedural laboratory examination: Secondary | ICD-10-CM | POA: Insufficient documentation

## 2024-06-24 DIAGNOSIS — Z01818 Encounter for other preprocedural examination: Secondary | ICD-10-CM

## 2024-06-26 NOTE — H&P (Signed)
 CC/HPI: cc: urethral stricture   Prostate cancer s/p brachytherapy  Urethral stricture - multiple urethral dilation and injection of mitomycin  August 2023  TURP x 2 in [place] (resulting in incontinence)    05/15/2024: 83 year old man with slower urinary stream that has worsened. Urinalysis was consistent with infection. He is here today for follow-up and possible cystoscopy to look for recurrence of stricture. He feels better after being treated for infection.     ALLERGIES: Banana Methylparaben    MEDICATIONS: Bactrim DS 800-160 MG Tablet 1 tablet PO BID  NexIUM 40 MG Capsule Delayed Release 1 capsule PO Daily  Tamsulosin HCl 0.4 MG Capsule 1 capsule PO Q HS  Brain Booster  D 1000 25 MCG (1000 UT) Capsule 1 capsule PO Daily  Gabapentin  Pepto-Bismol  Probenecid  1 tablet PO Daily  Probiotic  Psyllium Fiber 0.52 GM Capsule 1 capsule PO Daily  Slow-Mag 1 PO Daily  Tylenol  Extra Strength 500 MG Tablet  valACYclovir  HCl 500 MG Tablet 1 tablet PO Daily     GU PSH: Cysto Dilate Stricture (M or F) - 2023, 2023, 2017 Cystoscopy Collagen Injection - 2023 Cystoscopy Insert Stent - 2012, 2012, 2012 Ureteroscopic stone removal - 2012       PSH Notes: Cystoscopy With Insertion Of Ureteral Stent Right, Cystoscopy With Ureteroscopy With Removal Of Calculus, Cystoscopy With Insertion Of Ureteral Stent Right, Cystoscopy With Insertion Of Ureteral Stent Right   NON-GU PSH: Tonsillectomy.. Visit Complexity (formerly GPC1X) - 05/03/2024, 10/06/2023     GU PMH: Chronic cystitis (w/o hematuria) - 05/03/2024, - 2024, - 05/27/2022 History of prostate cancer - 05/03/2024, - 10/06/2023, - 03/10/2023, - 05/27/2022, - 2023 Postprocedural urethral stricture, male, Unspec - 05/03/2024, - 10/06/2023, - 03/10/2023, - 2024, - 2024, - 05/27/2022, - 2023, - 2023, - 2023, - 2023, - 2023, - 2022, - 2022 Straining on Urination - 05/03/2024 Stress Incontinence - 05/03/2024, - 10/06/2023, - 03/10/2023, - 2024, - 2024, - 05/27/2022, -  2023 Weak Urinary Stream - 05/03/2024 Prostate Cancer - 2024, - 2023, - 2023, - 2022, - 2022 (Stable, Chronic), - 2017 Urinary Retention - 2023, - 2023 (Acute), - 2017 Epididymo-orchitis (Acute), Left - 2017 Renal calculus, Nephrolithiasis - 2014      PMH Notes:  1898-08-29 00:00:00 - Note: Normal Routine History And Physical Designer, 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 Smoked for 10 years. Smoked 2 packs per day.  Has never drank.  Drinks 2 caffeinated drinks per day. Patient's occupation is/was Retired.    REVIEW OF SYSTEMS:    GU Review Male:   Patient denies frequent urination, hard to postpone urination, burning/ pain with urination, get up at night to urinate, leakage of urine, stream starts and stops, trouble starting your stream, have to strain to urinate , erection problems, and penile pain.  Gastrointestinal (Upper):   Patient denies nausea, vomiting, and indigestion/ heartburn.  Gastrointestinal (Lower):   Patient denies diarrhea and constipation.  Constitutional:   Patient denies fever, night sweats, weight loss, and fatigue.  Skin:   Patient denies skin rash/ lesion and itching.  Eyes:   Patient denies blurred vision and double vision.  Ears/  Nose/ Throat:   Patient denies sore throat and sinus problems.  Hematologic/Lymphatic:   Patient denies swollen glands and easy bruising.  Cardiovascular:   Patient denies leg swelling and chest pains.  Respiratory:   Patient denies cough and shortness of breath.  Endocrine:   Patient denies excessive thirst.  Musculoskeletal:   Patient  denies back pain and joint pain.  Neurological:   Patient denies headaches and dizziness.  Psychologic:   Patient denies depression and anxiety.   VITAL SIGNS: None   MULTI-SYSTEM PHYSICAL EXAMINATION:    Constitutional: Well-nourished. No physical deformities. Normally developed. Good grooming.  Neck: Neck symmetrical, not swollen. Normal tracheal position.  Respiratory: No labored breathing, no use of accessory muscles.   Skin: No paleness, no jaundice, no cyanosis. No lesion, no ulcer, no rash.  Neurologic / Psychiatric: Oriented to time, oriented to place, oriented to person. No depression, no anxiety, no agitation.  Eyes: Normal conjunctivae. Normal eyelids.  Ears, Nose, Mouth, and Throat: Left ear no scars, no lesions, no masses. Right ear no scars, no lesions, no masses. Nose no scars, no lesions, no masses. Normal hearing. Normal lips.  Musculoskeletal: Normal gait and station of head and neck.     Complexity of Data:  Records Review:   Previous Patient Records, POC Tool  Urine Test Review:   Urinalysis, Urine Culture   03/16/22  PSA  Total PSA <0.015 ng/mL    PROCEDURES:         Flexible Cystoscopy - 52000  Risks, benefits, and some of the potential complications of the procedure were discussed at length with the patient including infection, bleeding, voiding discomfort, urinary retention, fever, chills, sepsis, and others. All questions were answered. Informed consent was obtained. Sterile technique and intraurethral analgesia were used.  Meatus:  Normal size. Normal location. Normal condition.  Urethra:  Mild membranous stricture unable to traverse scope past it      The lower urinary tract was carefully examined. The procedure was well-tolerated and without complications. Antibiotic instructions were given. Instructions were given to call the office immediately for bloody urine, difficulty urinating, urinary retention, painful or frequent urination, fever, chills, nausea,  vomiting or other illness. The patient stated that he understood these instructions and would comply with them.         Visit Complexity - G2211    ASSESSMENT:      ICD-10 Details  1 GU:   Postprocedural urethral stricture, male, Unspec - N99.114 Chronic, Exacerbation  2   Chronic cystitis (w/o hematuria) - N30.20 Chronic, Stable  3   Weak Urinary Stream - R39.12 Chronic, Exacerbation   PLAN:           Document Letter(s):  Created for Patient: Clinical Summary         Notes:   Urethral stricture:  - Patient with stricture recurrence on diagnostic cystoscopy  - He is traveling to [place] in November so we will plan on Optilume urethral dilation next available  - Risks and benefits discussed and patient has undergone this multiple times in the past.

## 2024-06-27 ENCOUNTER — Encounter (HOSPITAL_COMMUNITY): Payer: Self-pay | Admitting: Urology

## 2024-06-27 ENCOUNTER — Other Ambulatory Visit (HOSPITAL_COMMUNITY): Payer: Self-pay

## 2024-06-27 ENCOUNTER — Ambulatory Visit (HOSPITAL_COMMUNITY)
Admission: RE | Admit: 2024-06-27 | Discharge: 2024-06-27 | Disposition: A | Discharge status: Home / Self Care | Attending: Urology | Admitting: Urology

## 2024-06-27 ENCOUNTER — Ambulatory Visit (HOSPITAL_COMMUNITY): Admitting: Anesthesiology

## 2024-06-27 ENCOUNTER — Encounter (HOSPITAL_COMMUNITY): Admission: RE | Disposition: A | Payer: Self-pay | Source: Home / Self Care | Attending: Urology

## 2024-06-27 ENCOUNTER — Ambulatory Visit (HOSPITAL_COMMUNITY)

## 2024-06-27 DIAGNOSIS — M1990 Unspecified osteoarthritis, unspecified site: Secondary | ICD-10-CM | POA: Insufficient documentation

## 2024-06-27 DIAGNOSIS — N35912 Unspecified bulbous urethral stricture, male: Secondary | ICD-10-CM

## 2024-06-27 DIAGNOSIS — Z8042 Family history of malignant neoplasm of prostate: Secondary | ICD-10-CM | POA: Diagnosis not present

## 2024-06-27 DIAGNOSIS — G4730 Sleep apnea, unspecified: Secondary | ICD-10-CM | POA: Insufficient documentation

## 2024-06-27 DIAGNOSIS — K219 Gastro-esophageal reflux disease without esophagitis: Secondary | ICD-10-CM | POA: Insufficient documentation

## 2024-06-27 DIAGNOSIS — Z87891 Personal history of nicotine dependence: Secondary | ICD-10-CM | POA: Insufficient documentation

## 2024-06-27 DIAGNOSIS — N99114 Postprocedural urethral stricture, male, unspecified: Secondary | ICD-10-CM | POA: Diagnosis present

## 2024-06-27 DIAGNOSIS — F418 Other specified anxiety disorders: Secondary | ICD-10-CM | POA: Diagnosis not present

## 2024-06-27 DIAGNOSIS — N183 Chronic kidney disease, stage 3 unspecified: Secondary | ICD-10-CM

## 2024-06-27 DIAGNOSIS — Z923 Personal history of irradiation: Secondary | ICD-10-CM | POA: Insufficient documentation

## 2024-06-27 DIAGNOSIS — Z8546 Personal history of malignant neoplasm of prostate: Secondary | ICD-10-CM | POA: Diagnosis not present

## 2024-06-27 HISTORY — PX: CYSTOURETHROSCOPY, W/ URETHRAL STRICTURE DILATION USING DRUG-COATED BALLOON: SHX7696

## 2024-06-27 SURGERY — CYSTOURETHROSCOPY, WITH URETHRAL STRICTURE DILATION USING DRUG-COATED BALLOON
Anesthesia: Monitor Anesthesia Care

## 2024-06-27 MED ORDER — FENTANYL CITRATE (PF) 50 MCG/ML IJ SOSY
PREFILLED_SYRINGE | INTRAMUSCULAR | Status: AC
  Filled 2024-06-27: qty 1

## 2024-06-27 MED ORDER — LACTATED RINGERS IV SOLN: INTRAVENOUS | Status: DC

## 2024-06-27 MED ORDER — PROPOFOL 10 MG/ML IV BOLUS
INTRAVENOUS | Status: DC | PRN
Start: 2024-06-27 — End: 2024-06-27
  Administered 2024-06-27: 10 mg via INTRAVENOUS
  Administered 2024-06-27 (×2): 20 mg via INTRAVENOUS
  Administered 2024-06-27: 10 mg via INTRAVENOUS

## 2024-06-27 MED ORDER — CIPROFLOXACIN HCL 250 MG PO TABS
250.0000 mg | ORAL_TABLET | Freq: Every day | ORAL | 0 refills | Status: AC
  Filled 2024-06-27: qty 7, 7d supply, fill #0

## 2024-06-27 MED ORDER — ORAL CARE MOUTH RINSE: 15.0000 mL | Freq: Once | OROMUCOSAL | Status: AC

## 2024-06-27 MED ORDER — CIPROFLOXACIN IN D5W 400 MG/200ML IV SOLN
400.0000 mg | INTRAVENOUS | Status: AC
  Administered 2024-06-27: 400 mg via INTRAVENOUS
  Filled 2024-06-27: qty 200

## 2024-06-27 MED ORDER — IOHEXOL 300 MG/ML  SOLN
INTRAMUSCULAR | Status: DC | PRN
  Administered 2024-06-27: 50 mL

## 2024-06-27 MED ORDER — STERILE WATER FOR IRRIGATION IR SOLN
Status: DC | PRN
  Administered 2024-06-27: 3000 mL

## 2024-06-27 MED ORDER — PROPOFOL 500 MG/50ML IV EMUL
INTRAVENOUS | Status: DC | PRN
Start: 2024-06-27 — End: 2024-06-27
  Administered 2024-06-27: 120 ug/kg/min via INTRAVENOUS

## 2024-06-27 MED ORDER — FENTANYL CITRATE (PF) 50 MCG/ML IJ SOSY
25.0000 ug | PREFILLED_SYRINGE | INTRAMUSCULAR | Status: DC | PRN
  Administered 2024-06-27 (×2): 50 ug via INTRAVENOUS

## 2024-06-27 MED ORDER — CHLORHEXIDINE GLUCONATE 0.12 % MT SOLN
15.0000 mL | Freq: Once | OROMUCOSAL | Status: AC
  Administered 2024-06-27: 15 mL via OROMUCOSAL

## 2024-06-27 MED ORDER — PROPOFOL 500 MG/50ML IV EMUL
INTRAVENOUS | Status: AC
  Filled 2024-06-27: qty 50

## 2024-06-27 SURGICAL SUPPLY — 19 items
BAG URINE DRAIN 2000ML AR STRL (UROLOGICAL SUPPLIES) ×1 IMPLANT
BALLOON NEPHROSTOMY (BALLOONS) IMPLANT
BALLOON OPTILUME DCB 30X5X75 (BALLOONS) ×1 IMPLANT
CATH FOLEY 2W COUNCIL 20FR 5CC (CATHETERS) IMPLANT
CATH FOLEY 2W COUNCIL 5CC 16FR (CATHETERS) IMPLANT
CATH ROBINSON RED A/P 14FR (CATHETERS) IMPLANT
CATH URETL OPEN 5X70 (CATHETERS) IMPLANT
CLOTH BEACON ORANGE TIMEOUT ST (SAFETY) ×2 IMPLANT
DEVICE INFLATION ATRION QL4015 (MISCELLANEOUS) IMPLANT
GLOVE BIO SURGEON STRL SZ 6 (GLOVE) ×1 IMPLANT
GOWN STRL SURGICAL XL XLNG (GOWN DISPOSABLE) ×2 IMPLANT
GUIDEWIRE ANG ZIPWIRE 038X150 (WIRE) IMPLANT
GUIDEWIRE STR DUAL SENSOR (WIRE) ×1 IMPLANT
HOLDER FOLEY CATH W/STRAP (MISCELLANEOUS) IMPLANT
KIT TURNOVER KIT A (KITS) ×2 IMPLANT
MANIFOLD NEPTUNE II (INSTRUMENTS) IMPLANT
NS IRRIG 1000ML POUR BTL (IV SOLUTION) IMPLANT
PACK CYSTO (CUSTOM PROCEDURE TRAY) ×2 IMPLANT
WATER STERILE IRR 3000ML UROMA (IV SOLUTION) ×2 IMPLANT

## 2024-06-27 NOTE — Anesthesia Preprocedure Evaluation (Addendum)
 Anesthesia Evaluation  Patient identified by MRN, date of birth, ID band Patient awake    Reviewed: Allergy & Precautions, H&P , NPO status , Patient's Chart, lab work & pertinent test results  Airway Mallampati: II  TM Distance: >3 FB Neck ROM: Full    Dental no notable dental hx. (+) Teeth Intact, Dental Advisory Given   Pulmonary sleep apnea , former smoker   Pulmonary exam normal breath sounds clear to auscultation       Cardiovascular negative cardio ROS  Rhythm:Regular Rate:Normal     Neuro/Psych   Anxiety Depression    negative neurological ROS     GI/Hepatic Neg liver ROS, hiatal hernia,GERD  Medicated,,  Endo/Other  negative endocrine ROS    Renal/GU Renal disease  negative genitourinary   Musculoskeletal  (+) Arthritis , Osteoarthritis,    Abdominal   Peds  Hematology negative hematology ROS (+)   Anesthesia Other Findings   Reproductive/Obstetrics negative OB ROS                              Anesthesia Physical Anesthesia Plan  ASA: 3  Anesthesia Plan: MAC   Post-op Pain Management: Minimal or no pain anticipated   Induction: Intravenous  PONV Risk Score and Plan: 2 and Propofol  infusion and Ondansetron   Airway Management Planned: Natural Airway and Simple Face Mask  Additional Equipment:   Intra-op Plan:   Post-operative Plan:   Informed Consent: I have reviewed the patients History and Physical, chart, labs and discussed the procedure including the risks, benefits and alternatives for the proposed anesthesia with the patient or authorized representative who has indicated his/her understanding and acceptance.     Dental advisory given  Plan Discussed with: CRNA  Anesthesia Plan Comments:          Anesthesia Quick Evaluation

## 2024-06-27 NOTE — Anesthesia Postprocedure Evaluation (Signed)
 Anesthesia Post Note  Patient: Chris Burton  Procedure(s) Performed: CYSTOURETHROSCOPY, WITH URETHRAL STRICTURE DILATION USING DRUG-COATED BALLOON     Patient location during evaluation: PACU Anesthesia Type: MAC Level of consciousness: awake and alert Pain management: pain level controlled Vital Signs Assessment: post-procedure vital signs reviewed and stable Respiratory status: spontaneous breathing, nonlabored ventilation and respiratory function stable Cardiovascular status: stable and blood pressure returned to baseline Postop Assessment: no apparent nausea or vomiting Anesthetic complications: no   No notable events documented.  Last Vitals:  Vitals:   06/27/24 1115 06/27/24 1130  BP: (!) 145/91 (!) 146/90  Pulse: 75 79  Resp: 15 (!) 22  Temp:  36.4 C  SpO2: 100% 100%    Last Pain:  Vitals:   06/27/24 1107  TempSrc:   PainSc: 2                  Genella Bas,W. EDMOND

## 2024-06-27 NOTE — Discharge Instructions (Addendum)
 Cystoscopy with Optilume Urethral Dilation patient instructions  Following a cystoscopy, a catheter (a flexible rubber tube) is sometimes left in place to empty the bladder. This may cause some discomfort or a feeling that you need to urinate. Your doctor determines the period of time that the catheter will be left in place. You may have bloody urine for two to three days (Call your doctor if the amount of bleeding increases or does not subside).  You may pass blood clots in your urine, especially if you had a biopsy. It is not unusual to pass small blood clots and have some bloody urine a couple of weeks after your cystoscopy. Again, call your doctor if the bleeding does not subside. You may have: Dysuria (painful urination) Frequency (urinating often) Urgency (strong desire to urinate)  These symptoms are common especially if medicine is instilled into the bladder or a ureteral stent is placed. Avoiding alcohol and caffeine, such as coffee, tea, and chocolate, may help relieve these symptoms. Drink plenty of water, unless otherwise instructed. Your doctor may also prescribe an antibiotic or other medicine to reduce these symptoms.  Cystoscopy results are available soon after the procedure; biopsy results usually take two to four days. Your doctor will discuss the results of your exam with you. Before you go home, you will be given specific instructions for follow-up care. Special Instructions:   If you are going home with a catheter in place do not take a tub bath until removed by your doctor.   You may resume your normal activities.   Do not drive or operate machinery if you are taking narcotic pain medicine.   Be sure to keep all follow-up appointments with your doctor.   Call Your Doctor If: The catheter is not draining You have severe pain You are unable to urinate You have a fever over 101 You have severe bleeding         **Please use a condom for sexual activity for the next 4  weeks**

## 2024-06-27 NOTE — Op Note (Signed)
 Operative Note  Preoperative diagnosis:  1.  Bulbar urethral stricture  Postoperative diagnosis: 1.  Bulbar urethral stricture  Procedure(s): 1.  Cystoscopy with optilume urethral dilation  Surgeon: Valli Shank, MD  Assistants:  None  Anesthesia:  MAC  Complications:  None  EBL:  minimal  Specimens: 1. none  Drains/Catheters: 1.  16Fr council tip foley  Intraoperative findings:   Normal urethral meatus Bulbar urethral stricture - approximately 5Fr Bilateral orthotopic Uos Mild trabeculation  Indication:  Chris Burton is a 83 y.o. male with a history of prostate cancer s/p brachytherapy.  He's had a recurrent urethral stricture - multiple urethral dilation and injection of mitomycin  August 2023. TURP x 2 in [place] (resulting in incontinence)   Description of procedure: After risks and benefits were discussed with the patient, informed consent was obtained.  The patient was taken to the operating room and placed in the supine position.  Anesthesia was induced and antibiotics were administered.  He was then repositioned in the dorsal lithotomy position and prepped and draped in the usual sterile fashion. A time out was performed.   A 21Fr rigid cystoscope was placed in the urethral meatus and advanced to the bulbar urethra. A 0.38 sensor wire was advanced through the urethral stricture into the bladder under fluoroscopic guidance. The ultraxx balloon dilator was advanced over the wire so that the radioopaque markers spanned the strictured segment.  The balloon was inflated and help in placed for 1 minute.  It was then deflated and removed.  Diagnostic cytoscopy took placed alongside the wire. There were no seeds seen in prostatic urethral or bladder.  The cystoscope was removed taking care to keep the wire in place. The optilume urethral balloon was then advanced over the wire so that again the radioopaque markers spanned the strictured segment. It was inflated to 10mm of  Hg and kept inflated for 5 minutes.  It was deflated and the wire was kept in place. A 16Fr council tip foley was advanced over the wire until it was hubbed. 10cc of sterile water was used to inflate the balloon and the catheter was placed to gravity drainage.   The patient emerged from anesthesia and was transferred to the PACU in sterile condition.   Plan:  Void trial on 06/29/24

## 2024-06-27 NOTE — Interval H&P Note (Signed)
 History and Physical Interval Note:  06/27/2024 9:42 AM  Chris Burton  has presented today for surgery, with the diagnosis of URETHRAL STRICTURE.  The various methods of treatment have been discussed with the patient and family. After consideration of risks, benefits and other options for treatment, the patient has consented to  Procedure(s) with comments: CYSTOURETHROSCOPY, WITH URETHRAL STRICTURE DILATION USING DRUG-COATED BALLOON (N/A) - CYSTOSCOPY WITH URETHRAL DILATION (OPTILUME) as a surgical intervention.  The patient's history has been reviewed, patient examined, no change in status, stable for surgery.  I have reviewed the patient's chart and labs.  Questions were answered to the patient's satisfaction.     Roselyne Stalnaker D Deseree Zemaitis

## 2024-06-27 NOTE — Transfer of Care (Signed)
 Immediate Anesthesia Transfer of Care Note  Patient: PRIYANSH PRY  Procedure(s) Performed: CYSTOURETHROSCOPY, WITH URETHRAL STRICTURE DILATION USING DRUG-COATED BALLOON  Patient Location: PACU  Anesthesia Type:MAC  Level of Consciousness: sedated  Airway & Oxygen Therapy: Patient Spontanous Breathing and Patient connected to face mask oxygen  Post-op Assessment: Report given to RN and Post -op Vital signs reviewed and stable  Post vital signs: Reviewed and stable  Last Vitals:  Vitals Value Taken Time  BP    Temp    Pulse 78 06/27/24 10:30  Resp 16 06/27/24 10:30  SpO2 100 % 06/27/24 10:30  Vitals shown include unfiled device data.  Last Pain:  Vitals:   06/27/24 0839  TempSrc:   PainSc: 0-No pain         Complications: No notable events documented.

## 2024-06-28 ENCOUNTER — Encounter (HOSPITAL_COMMUNITY): Payer: Self-pay | Admitting: Urology

## 2024-08-19 ENCOUNTER — Other Ambulatory Visit (HOSPITAL_COMMUNITY): Payer: Self-pay
